# Patient Record
Sex: Male | Born: 1955 | Race: Black or African American | Hispanic: No | Marital: Single | Smoking: Former smoker
Health system: Southern US, Community
[De-identification: ages and names within clinical notes are randomized; demographics above are authoritative.]

## PROBLEM LIST (undated history)

## (undated) DIAGNOSIS — M545 Low back pain, unspecified: Secondary | ICD-10-CM

## (undated) DIAGNOSIS — I10 Essential (primary) hypertension: Secondary | ICD-10-CM

## (undated) DIAGNOSIS — I509 Heart failure, unspecified: Secondary | ICD-10-CM

## (undated) HISTORY — DX: Low back pain, unspecified: M54.50

## (undated) HISTORY — DX: Heart failure, unspecified: I50.9

## (undated) HISTORY — DX: Essential (primary) hypertension: I10

## (undated) HISTORY — PX: HERNIA REPAIR: SHX51

---

## 2020-07-09 ENCOUNTER — Ambulatory Visit (INDEPENDENT_AMBULATORY_CARE_PROVIDER_SITE_OTHER): Payer: Medicaid Other | Admitting: Internal Medicine

## 2020-07-09 ENCOUNTER — Other Ambulatory Visit: Payer: Self-pay

## 2020-07-09 ENCOUNTER — Encounter: Payer: Self-pay | Admitting: Internal Medicine

## 2020-07-09 VITALS — BP 199/122 | HR 81 | Temp 98.1°F | Wt 275.7 lb

## 2020-07-09 DIAGNOSIS — I10 Essential (primary) hypertension: Secondary | ICD-10-CM | POA: Diagnosis not present

## 2020-07-09 DIAGNOSIS — M549 Dorsalgia, unspecified: Secondary | ICD-10-CM | POA: Insufficient documentation

## 2020-07-09 DIAGNOSIS — I509 Heart failure, unspecified: Secondary | ICD-10-CM

## 2020-07-09 DIAGNOSIS — Z131 Encounter for screening for diabetes mellitus: Secondary | ICD-10-CM | POA: Diagnosis not present

## 2020-07-09 DIAGNOSIS — Z23 Encounter for immunization: Secondary | ICD-10-CM | POA: Diagnosis present

## 2020-07-09 DIAGNOSIS — M5442 Lumbago with sciatica, left side: Secondary | ICD-10-CM | POA: Diagnosis not present

## 2020-07-09 DIAGNOSIS — I5030 Unspecified diastolic (congestive) heart failure: Secondary | ICD-10-CM | POA: Insufficient documentation

## 2020-07-09 DIAGNOSIS — G8929 Other chronic pain: Secondary | ICD-10-CM | POA: Diagnosis not present

## 2020-07-09 LAB — POCT GLYCOSYLATED HEMOGLOBIN (HGB A1C): Hemoglobin A1C: 5.2 % (ref 4.0–5.6)

## 2020-07-09 LAB — GLUCOSE, CAPILLARY: Glucose-Capillary: 92 mg/dL (ref 70–99)

## 2020-07-09 NOTE — Assessment & Plan Note (Signed)
Patient reports having chronic back pain.  He fell down a flight of stairs approximately 10 years ago.  Reports the back pain comes and goes. He describes pain that starts in his back and goes to his left hip and down to his left knee.  We will check lab work today and start with conservative therapy given his chronic problem.  If no improvement will consider imaging.  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

## 2020-07-09 NOTE — Patient Instructions (Addendum)
1. Heart failure, unspecified HF chronicity, unspecified heart failure type (HCC) We will obtain an ultrasound of your heart to better evaluate your history of heart failure.  After your blood work is back we can start medications to treat your heart failure. - Lipid Profile - ECHOCARDIOGRAM COMPLETE; Future  2. Hypertension, unspecified type I will call with the results of your lab work tomorrow and we can start medications at this time to treat your blood pressure. - CMP14 + Anion Gap - POC Hbg A1C  3. Chronic left-sided low back pain with left-sided sciatica We will obtain records from the [place] system and once her blood work is back I can recommend medications to help with back pain. - CBC with Diff

## 2020-07-09 NOTE — Progress Notes (Signed)
   CC: Hypertension   HPI:Mr.Tahir Blank is a 64 y.o. male who presents for evaluation of hypertension and low back pain. Please see individual problem based A/P for details.  Depression, PHQ-9: Based on the patients    Office Visit from 07/09/2020 in [facility]  PHQ-9 Total Score 5    Mild depression  Past Medical History:  Diagnosis Date  . Hypertension    Past Surgical History:  Procedure Laterality Date  . HERNIA REPAIR     When he was approximately 6 years   Family History  Problem Relation Age of Onset  . Stroke Mother 49  . Heart attack Father   . Cancer Father        17 , unknown type - passed away from this cancer  . Breast cancer Sister 61    Review of Systems:   Review of Systems  Constitutional: Negative for chills and fever.  HENT: Negative for sinus pain and sore throat.   Eyes: Negative for double vision and redness.  Respiratory: Negative for cough, sputum production and wheezing.   Cardiovascular: Positive for leg swelling. Negative for chest pain.  Gastrointestinal: Negative for nausea and vomiting.  Genitourinary: Negative for dysuria and urgency.  Musculoskeletal: Positive for back pain. Negative for joint pain.  Skin: Negative for itching and rash.  Neurological: Negative for dizziness and headaches.  Endo/Heme/Allergies: Negative for polydipsia. Does not bruise/bleed easily.  Psychiatric/Behavioral: Negative for memory loss and substance abuse.     Physical Exam: Vitals:   07/09/20 1010 07/09/20 1100  BP: (!) 209/118 (!) 199/122  Pulse: 85 81  Temp: 98.1 F (36.7 C)   TempSrc: Oral   SpO2: 100%   Weight: 275 lb 11.2 oz (125.1 kg)     General: NAD, nl appearance HE: Normocephalic, atraumatic , EOMI, Conjunctivae normal ENT: No congestion, no rhinorrhea, no exudate or erythema  Cardiovascular: Normal rate, regular rhythm.  No murmurs, rubs, or gallops Pulmonary : Effort normal, breath sounds normal. No  wheezes, rales, or rhonchi Abdominal: soft, nontender,  bowel sounds present Musculoskeletal: Bilateral 2+ lower extremity edema, no deformities Skin: Cool hands and feet, contusion on right forearm Psychiatric/Behavioral:  normal mood, normal behavior     Assessment & Plan:   See Encounters Tab for problem based charting.  Patient discussed with Dr. Cleda Daub

## 2020-07-09 NOTE — Assessment & Plan Note (Addendum)
Frank Figueroa presents to clinic today as a new patient with a history of heart failure.  He is unsure whether he has systolic or diastolic heart failure.  He does not have any medical records to present at this time.  Systolic blood pressure is elevated to 200 on exam and I would expect he has diastolic heart failure given this data. Denies a history of ischemic heart disease.  He denies orthopnea or PND.  On exam he does have lower extremity pitting edema.  He has been out of his Lasix.  Reports taking Lasix 40 mg twice a day in the past.  Assessment: History of heart failure , volume overloaded on exam.  Plan: - Check BMP, will prescribe Lasix pending lab results -Follow-up in 1 week -TTE -Lipid panel  Addendum:   The 10-year ASCVD risk score Frank Figueroa) is: 18%   Values used to calculate the score:     Age: 64 years     Sex: Male     Is Non-Hispanic African American: Yes     Diabetic: No     Tobacco smoker: No     Systolic Blood Pressure: 199 mmHg     Is BP treated: No     HDL Cholesterol: 53 mg/dL     Total Cholesterol: 163 mg/dL

## 2020-07-09 NOTE — Assessment & Plan Note (Signed)
Hypertension: Patient's BP today is 209/118 and 199/122 with a goal of <140/80.  The patient is not currently on any medication.  Reports he was on lisinopril in the past.  Denies chest pain, headache, visual changes, lightheadedness, weakness, or dizziness on standing.   Assessment: Uncontrolled hypertension.  We will check lab work today and start medication pending results.  He will likely need multiple blood pressure medications and will consider starting a combination pill.  Plan: -BMP

## 2020-07-10 ENCOUNTER — Telehealth: Payer: Self-pay | Admitting: Internal Medicine

## 2020-07-10 ENCOUNTER — Telehealth: Payer: Self-pay | Admitting: *Deleted

## 2020-07-10 DIAGNOSIS — E785 Hyperlipidemia, unspecified: Secondary | ICD-10-CM | POA: Insufficient documentation

## 2020-07-10 DIAGNOSIS — I10 Essential (primary) hypertension: Secondary | ICD-10-CM

## 2020-07-10 DIAGNOSIS — I509 Heart failure, unspecified: Secondary | ICD-10-CM

## 2020-07-10 LAB — CMP14 + ANION GAP
ALT: 28 IU/L (ref 0–44)
AST: 30 IU/L (ref 0–40)
Albumin/Globulin Ratio: 1.3 (ref 1.2–2.2)
Albumin: 4.4 g/dL (ref 3.8–4.8)
Alkaline Phosphatase: 119 IU/L (ref 44–121)
Anion Gap: 14 mmol/L (ref 10.0–18.0)
BUN/Creatinine Ratio: 13 (ref 10–24)
BUN: 26 mg/dL (ref 8–27)
Bilirubin Total: 0.5 mg/dL (ref 0.0–1.2)
CO2: 23 mmol/L (ref 20–29)
Calcium: 8.9 mg/dL (ref 8.6–10.2)
Chloride: 103 mmol/L (ref 96–106)
Creatinine, Ser: 1.96 mg/dL — ABNORMAL HIGH (ref 0.76–1.27)
GFR calc Af Amer: 41 mL/min/{1.73_m2} — ABNORMAL LOW (ref >59)
GFR calc non Af Amer: 35 mL/min/{1.73_m2} — ABNORMAL LOW (ref >59)
Globulin, Total: 3.5 g/dL (ref 1.5–4.5)
Glucose: 92 mg/dL (ref 65–99)
Potassium: 4.5 mmol/L (ref 3.5–5.2)
Sodium: 140 mmol/L (ref 134–144)
Total Protein: 7.9 g/dL (ref 6.0–8.5)

## 2020-07-10 LAB — CBC WITH DIFFERENTIAL/PLATELET
Basophils Absolute: 0 10*3/uL (ref 0.0–0.2)
Basos: 0 %
EOS (ABSOLUTE): 0.2 10*3/uL (ref 0.0–0.4)
Eos: 2 %
Hematocrit: 42.5 % (ref 37.5–51.0)
Hemoglobin: 14 g/dL (ref 13.0–17.7)
Immature Grans (Abs): 0 10*3/uL (ref 0.0–0.1)
Immature Granulocytes: 0 %
Lymphocytes Absolute: 2.3 10*3/uL (ref 0.7–3.1)
Lymphs: 23 %
MCH: 30.9 pg (ref 26.6–33.0)
MCHC: 32.9 g/dL (ref 31.5–35.7)
MCV: 94 fL (ref 79–97)
Monocytes Absolute: 0.8 10*3/uL (ref 0.1–0.9)
Monocytes: 8 %
Neutrophils Absolute: 6.5 10*3/uL (ref 1.4–7.0)
Neutrophils: 67 %
Platelets: 142 10*3/uL — ABNORMAL LOW (ref 150–450)
RBC: 4.53 x10E6/uL (ref 4.14–5.80)
RDW: 12.9 % (ref 11.6–15.4)
WBC: 9.9 10*3/uL (ref 3.4–10.8)

## 2020-07-10 LAB — LIPID PANEL
Chol/HDL Ratio: 3.1 ratio (ref 0.0–5.0)
Cholesterol, Total: 163 mg/dL (ref 100–199)
HDL: 53 mg/dL (ref >39)
LDL Chol Calc (NIH): 99 mg/dL (ref 0–99)
Triglycerides: 54 mg/dL (ref 0–149)
VLDL Cholesterol Cal: 11 mg/dL (ref 5–40)

## 2020-07-10 NOTE — Telephone Encounter (Signed)
Called patient 4 times today without an answer.  [phone] . I am unsure if the phone number listed in the chart is correct.  I would like to discuss patient's lab results before sending in prescriptions.  Please verify patient's phone number if he calls the clinic.

## 2020-07-10 NOTE — Telephone Encounter (Signed)
Pt calls and states he was supposed to have some meds sent to his pharmacy per dr Barbaraann Faster

## 2020-07-10 NOTE — Addendum Note (Signed)
Addended by: Gardenia Phlegm on: 07/10/2020 09:48 AM   Modules accepted: Orders

## 2020-07-10 NOTE — Telephone Encounter (Signed)
Called patient to share laboratory results x2.  No answer.  Plan to start following medications once unable to reach patient:  Lasix 40 mg twice daily Olmesartan 20 mg daily Amlodipine 5 mg daily  Patient is scheduled for a transthoracic echo to better evaluate his heart.  He reports a history of heart failure but cannot specify whether this is systolic or diastolic.  When these results return we will start him on goal-directed therapy if he has systolic heart failure.  In addition he has follow-up in 1 week to monitor diuresis and kidney function.

## 2020-07-11 MED ORDER — FUROSEMIDE 40 MG PO TABS: 40.0000 mg | ORAL_TABLET | Freq: Two times a day (BID) | ORAL | 1 refills | Status: DC

## 2020-07-11 MED ORDER — OLMESARTAN MEDOXOMIL 40 MG PO TABS: 40.0000 mg | ORAL_TABLET | Freq: Every day | ORAL | 1 refills | Status: DC

## 2020-07-11 MED ORDER — AMLODIPINE BESYLATE 5 MG PO TABS: 5.0000 mg | ORAL_TABLET | Freq: Every day | ORAL | 1 refills | Status: DC

## 2020-07-11 NOTE — Telephone Encounter (Signed)
Pt is having phone trouble, he will try to answer his phone today pls contact [phone]

## 2020-07-11 NOTE — Telephone Encounter (Signed)
Was able to reach patient today at phone number listed in chart.  His phone was temporarily off.  Sending prescriptions to [facility] on [address]  -Amlodipine 5 mg daily -Olmesartan 20 mg daily -Lasix 40 mg twice daily  -Follow-up in 1 week

## 2020-07-12 ENCOUNTER — Telehealth: Payer: Self-pay

## 2020-07-12 NOTE — Progress Notes (Signed)
Internal Medicine Clinic Attending  Case discussed with Dr. Barbaraann Faster  At the time of the visit.  We reviewed the resident's history and exam and pertinent patient test results.  I agree with the assessment, diagnosis, and plan of care documented in the resident's note. Labs returned reassuring, awaiting outpatient TTE, will start lasix, olmesartan and amlodipine in the interim.

## 2020-07-12 NOTE — Telephone Encounter (Signed)
Patient has chronic back pain without red flag symptoms. Recommended tylenol 1000 mg TID. Avoiding NSAID given recent kidney function. He has follow-up scheduled for heart failure.

## 2020-07-12 NOTE — Telephone Encounter (Signed)
Requesting med for pain, please call pt back.  

## 2020-07-19 ENCOUNTER — Encounter: Payer: Self-pay | Admitting: Internal Medicine

## 2020-07-19 ENCOUNTER — Ambulatory Visit (INDEPENDENT_AMBULATORY_CARE_PROVIDER_SITE_OTHER): Payer: Medicaid Other | Admitting: Internal Medicine

## 2020-07-19 ENCOUNTER — Other Ambulatory Visit: Payer: Self-pay

## 2020-07-19 VITALS — BP 152/77 | HR 81 | Temp 98.3°F | Ht 72.0 in | Wt 276.8 lb

## 2020-07-19 DIAGNOSIS — Z23 Encounter for immunization: Secondary | ICD-10-CM

## 2020-07-19 DIAGNOSIS — I10 Essential (primary) hypertension: Secondary | ICD-10-CM | POA: Diagnosis present

## 2020-07-19 DIAGNOSIS — I509 Heart failure, unspecified: Secondary | ICD-10-CM

## 2020-07-19 LAB — BRAIN NATRIURETIC PEPTIDE: B Natriuretic Peptide: 62.2 pg/mL (ref 0.0–100.0)

## 2020-07-19 NOTE — Patient Instructions (Addendum)
Thank you, [name] for allowing [place] to provide your care today. Today we discussed heart failure , neuropathy, and high blood pressure.    I have ordered the following labs for you:   Lab Orders     BMP8+Anion Gap     Brain natriuretic peptide   Tests ordered today: none   Referrals ordered today:   Referral Orders  No referral(s) requested today     I have ordered the following medication/changed the following medications:   Stop the following medications: There are no discontinued medications.   Start the following medications: No orders of the defined types were placed in this encounter.    Follow up: pending results of blood work   Remember: I will call with results of blood work on Monday  Should you have any questions or concerns please call the internal medicine clinic at [phone].      Thurmon Fair, M.D. [facility]

## 2020-07-19 NOTE — Progress Notes (Signed)
   CC: hypertension  HPI:[name] is a 64 y.o. male who presents for evaluation of hypertension and heart failure. Patient was late for his visit today, but we were able to address acute problems he was following up for. At his next visit I would like to address his hyperlipidemia and chronic pain. He reports he has been compliant on his blood pressure medications. He says he is urinating well, but cant say if his edema is improving. Please see individual problem based A/P for details.  Past Medical History:  Diagnosis Date  . Heart failure (HCC)   . Hypertension   . Low back pain    Review of Systems:   Review of Systems  Eyes: Negative for blurred vision and double vision.  Neurological: Negative for dizziness and headaches.     Physical Exam: Vitals:   07/19/20 1342  BP: (!) 152/77  Pulse: 81  Temp: 98.3 F (36.8 C)  TempSrc: Oral  SpO2: 99%  Weight: 276 lb 12.8 oz (125.6 kg)  Height: 6' (1.829 m)   General: NAD, nl appearance HEENT: Normocephalic, atraumatic , Conjunctiva nl  Cardiovascular: Normal rate, regular rhythm.  No murmurs, rubs, or gallops Pulmonary : Equal breath sounds, No wheezes, rales, or rhonchi Abdominal: soft, nontender,  bowel sounds present EXT: 2+ LE edema, no deformities   Assessment & Plan:   See Encounters Tab for problem based charting.  Patient discussed with Dr. Antony Contras

## 2020-07-20 LAB — BMP8+ANION GAP
Anion Gap: 14 mmol/L (ref 10.0–18.0)
BUN/Creatinine Ratio: 16 (ref 10–24)
BUN: 39 mg/dL — ABNORMAL HIGH (ref 8–27)
CO2: 22 mmol/L (ref 20–29)
Calcium: 8.8 mg/dL (ref 8.6–10.2)
Chloride: 104 mmol/L (ref 96–106)
Creatinine, Ser: 2.37 mg/dL — ABNORMAL HIGH (ref 0.76–1.27)
GFR calc Af Amer: 32 mL/min/{1.73_m2} — ABNORMAL LOW (ref >59)
GFR calc non Af Amer: 28 mL/min/{1.73_m2} — ABNORMAL LOW (ref >59)
Glucose: 94 mg/dL (ref 65–99)
Potassium: 4.3 mmol/L (ref 3.5–5.2)
Sodium: 140 mmol/L (ref 134–144)

## 2020-07-21 ENCOUNTER — Encounter: Payer: Self-pay | Admitting: Internal Medicine

## 2020-07-21 NOTE — Assessment & Plan Note (Signed)
°  Patient presents today with continued signs of volume overload, may be only slightly improved since last visit.  Endorses he has been taking Lasix 40 mg twice daily and having good urine output, although unmeasured.  He reports he has not been contacted to schedule his echo yet. He was given a number to call if not contacted by Monday. We will check a BNP today.  His weight for last 2 visits: Wt Readings from Last 3 Encounters:  07/19/20 276 lb 12.8 oz (125.6 kg)  07/09/20 275 lb 11.2 oz (125.1 kg)   Assessment: Hx of HF, unspecified type. Volume overloaded on exam. Will consider increasing lasix dose after lab work returns. Follow up in 1-2 weeks.  Plan:  -Continue Lasix 40 mg twice daily -TTE scheduled -Bnp -BMP

## 2020-07-21 NOTE — Assessment & Plan Note (Signed)
HPI:  Patient's BP today is much better. His BP at last visit was 199/122 and today his BP is 152/77. Goal BP is at least<140/80. We are still obtaining medical records and waiting on patient to complete echo to evaluate HF. He may need additional heart failure medications which effect BP, so holding off on additional medications today.  BMP Latest Ref Rng & Units 07/09/2020  Glucose 65 - 99 mg/dL 92  BUN 8 - 27 mg/dL 26  Creatinine 3.55 - 9.74 mg/dL 1.63(A)  BUN/Creat Ratio 10 - 24 13  Sodium 134 - 144 mmol/L 140  Potassium 3.5 - 5.2 mmol/L 4.5  Chloride 96 - 106 mmol/L 103  CO2 20 - 29 mmol/L 23  Calcium 8.6 - 10.2 mg/dL 8.9   Medication changes: No Assessment: HTN, improved since starting amlodipine and olmesartan.  Creatinine 1.96 at last visit, rechecking today. Plan: Continue amlodipine 5 mg daily Continue olmesartan 40 mg daily BMP today

## 2020-07-22 NOTE — Progress Notes (Signed)
Internal Medicine Clinic Attending  Case discussed with Dr. Barbaraann Faster  At the time of the visit.  We reviewed the resident's history and exam and pertinent patient test results.  I agree with the assessment, diagnosis, and plan of care documented in the resident's note.   BMP checked this visit shows and increase in creatinine and BUN from prior. This could be transient elevation after initiation of olmesartan vs. Prerenal injury from lasix use. Given normal BNP, would recommend holding lasix for a few days and having patient return for repeat labs. If renal function is unchanged, likely safe to assume this is from ARB initiation and continue with current management. If renal function improves, he likely needs a lower maintenance dose of lasix.

## 2020-07-24 ENCOUNTER — Telehealth: Payer: Self-pay | Admitting: Internal Medicine

## 2020-07-24 NOTE — Telephone Encounter (Signed)
Called patient x2 to ask him to hold his Lasix and come in 2 days later for BMP. Mailbox is full. Will attempt again tomorrow.

## 2020-07-25 ENCOUNTER — Telehealth: Payer: Self-pay | Admitting: Internal Medicine

## 2020-07-25 DIAGNOSIS — I10 Essential (primary) hypertension: Secondary | ICD-10-CM

## 2020-07-25 NOTE — Telephone Encounter (Signed)
Patient reports his phone has not been working. He will come to clinic tomorrow to have his blood work done. He has not received a call or has missed the call to schedule his TTE.

## 2020-07-26 ENCOUNTER — Encounter: Payer: Medicaid Other | Admitting: Internal Medicine

## 2020-07-26 ENCOUNTER — Telehealth: Payer: Self-pay

## 2020-07-26 ENCOUNTER — Other Ambulatory Visit: Payer: Self-pay

## 2020-07-26 ENCOUNTER — Other Ambulatory Visit (INDEPENDENT_AMBULATORY_CARE_PROVIDER_SITE_OTHER): Payer: Medicaid Other

## 2020-07-26 DIAGNOSIS — I10 Essential (primary) hypertension: Secondary | ICD-10-CM | POA: Diagnosis not present

## 2020-07-26 NOTE — Telephone Encounter (Signed)
This RN notified that patient was in the lab for lab-only appt and was c/o of eye pain.  RN presented to lab and patient states he thinks something "got into his left eye".  Left eye was red.  RN informed patient he will need an appt to be seen for evaluation and an appointment was offered with his PCP today at 2:15.  Patient declined this appt, states he "doesn't know if he can come back [facility] currently closed for lunch).  RN instructed patient to call back if he desires appt this afternoon or if eye becomes worse over weekend he can go to urgent care for evaluation.  He verbalized understanding. SChaplin, RN,BSN

## 2020-07-27 LAB — BMP8+ANION GAP
Anion Gap: 17 mmol/L (ref 10.0–18.0)
BUN/Creatinine Ratio: 17 (ref 10–24)
BUN: 30 mg/dL — ABNORMAL HIGH (ref 8–27)
CO2: 24 mmol/L (ref 20–29)
Calcium: 9.6 mg/dL (ref 8.6–10.2)
Chloride: 102 mmol/L (ref 96–106)
Creatinine, Ser: 1.76 mg/dL — ABNORMAL HIGH (ref 0.76–1.27)
GFR calc Af Amer: 47 mL/min/{1.73_m2} — ABNORMAL LOW (ref >59)
GFR calc non Af Amer: 40 mL/min/{1.73_m2} — ABNORMAL LOW (ref >59)
Glucose: 78 mg/dL (ref 65–99)
Potassium: 4.2 mmol/L (ref 3.5–5.2)
Sodium: 143 mmol/L (ref 134–144)

## 2020-07-30 NOTE — Progress Notes (Signed)
Called and updated patient on recent BMP.  Creatinine has improved and he will continue his Lasix and ARB

## 2020-08-16 ENCOUNTER — Other Ambulatory Visit: Payer: Self-pay

## 2020-08-16 ENCOUNTER — Ambulatory Visit (INDEPENDENT_AMBULATORY_CARE_PROVIDER_SITE_OTHER): Payer: Medicaid Other | Admitting: Internal Medicine

## 2020-08-16 ENCOUNTER — Encounter: Payer: Self-pay | Admitting: Internal Medicine

## 2020-08-16 VITALS — BP 149/108 | HR 73 | Temp 97.8°F | Ht 72.0 in | Wt 279.2 lb

## 2020-08-16 DIAGNOSIS — S0592XA Unspecified injury of left eye and orbit, initial encounter: Secondary | ICD-10-CM | POA: Diagnosis not present

## 2020-08-16 DIAGNOSIS — S0590XA Unspecified injury of unspecified eye and orbit, initial encounter: Secondary | ICD-10-CM | POA: Insufficient documentation

## 2020-08-16 DIAGNOSIS — M5416 Radiculopathy, lumbar region: Secondary | ICD-10-CM

## 2020-08-16 DIAGNOSIS — M5412 Radiculopathy, cervical region: Secondary | ICD-10-CM

## 2020-08-16 DIAGNOSIS — I10 Essential (primary) hypertension: Secondary | ICD-10-CM | POA: Diagnosis not present

## 2020-08-16 MED ORDER — TETRAHYDROZOLINE-ZN SULFATE 0.05-0.25 % OP SOLN: 2.0000 [drp] | Freq: Three times a day (TID) | OPHTHALMIC | 0 refills | Status: DC | PRN

## 2020-08-16 NOTE — Assessment & Plan Note (Signed)
Something flew in his eye about three months ago and he has had intermittent eye pain since then. About three days ago it became much worse and he had difficulty opening his eye. It has improved but continues to hurt a lot and has continued watering. It feels like there is something in his eye. No changes or blurring of vision.  On physical exam there is redness, especially to the inner lid, with sensitivity.   - refer to ophthalmology to evaluate  - visine drops prn  - alternate cool and warm compresses

## 2020-08-16 NOTE — Progress Notes (Addendum)
   CC: eye pain  HPI:  [name] is a 64 y.o. with PMH as below.   Please see A&P for assessment of the patient's acute and chronic medical conditions.   Past Medical History:  Diagnosis Date  . Heart failure (HCC)   . Hypertension   . Low back pain    Review of Systems:   Review of Systems  Constitutional: Negative for chills, fever and weight loss.  Eyes: Positive for pain, discharge and redness. Negative for blurred vision, double vision and photophobia.  Cardiovascular: Negative for chest pain and leg swelling.  Gastrointestinal: Negative for abdominal pain.  Musculoskeletal: Positive for back pain, joint pain, myalgias and neck pain. Negative for falls.  Neurological: Positive for tingling, sensory change and focal weakness. Negative for dizziness, tremors and headaches.   Physical Exam:   Constitution: NAD, appears stated age Cardio: RRR, no m/r/g, no LE edema  Respiratory: CTA, no w/r/r MSK: spurlings positive bilaterally but with pain radiating down the left for both tests  Upper extremities: LUE strength 4/5 in all ranges of motion; RUE 5/5 Lower extremities: RLE: strength 4/5 all ranges of motion; LLE: strength 2/5 knee extension, 4/5 plantar and dorsiflexion; 3/5 hip flexion Neuro: normal affect, a&ox3 Skin: c/d/i    Vitals:   08/16/20 0910 08/16/20 0918  BP: (!) 173/93 (!) 149/108  Pulse: 80 73  Temp: 97.8 F (36.6 C)   TempSrc: Oral   SpO2: 100%   Weight: 279 lb 3.2 oz (126.6 kg)   Height: 6' (1.829 m)      Assessment & Plan:   See Encounters Tab for problem based charting.  Patient discussed with Dr. Mayford Knife

## 2020-08-16 NOTE — Patient Instructions (Signed)
Thank you for allowing [place] to provide your care today. Today we discussed your eye pain and back pain   I have ordered the following labs for you: Basic metabolic panel     I will call if any are abnormal.    Please follow-up in two weeks for blood pressure check and other healthcare maintenance.    Please call the internal medicine center clinic if you have any questions or concerns, we may be able to help and keep you from a long and expensive emergency room wait. Our clinic and after hours phone number is [phone], the best time to call is Monday through Friday 9 am to 4 pm but there is always someone available 24/7 if you have an emergency. If you need medication refills please notify your pharmacy one week in advance and they will send [place] a request.

## 2020-08-17 LAB — BMP8+ANION GAP
Anion Gap: 14 mmol/L (ref 10.0–18.0)
BUN/Creatinine Ratio: 12 (ref 10–24)
BUN: 22 mg/dL (ref 8–27)
CO2: 20 mmol/L (ref 20–29)
Calcium: 8.8 mg/dL (ref 8.6–10.2)
Chloride: 106 mmol/L (ref 96–106)
Creatinine, Ser: 1.81 mg/dL — ABNORMAL HIGH (ref 0.76–1.27)
GFR calc Af Amer: 45 mL/min/{1.73_m2} — ABNORMAL LOW (ref >59)
GFR calc non Af Amer: 39 mL/min/{1.73_m2} — ABNORMAL LOW (ref >59)
Glucose: 97 mg/dL (ref 65–99)
Potassium: 4.2 mmol/L (ref 3.5–5.2)
Sodium: 140 mmol/L (ref 134–144)

## 2020-08-19 DIAGNOSIS — M5416 Radiculopathy, lumbar region: Secondary | ICD-10-CM | POA: Insufficient documentation

## 2020-08-19 DIAGNOSIS — M5412 Radiculopathy, cervical region: Secondary | ICD-10-CM | POA: Insufficient documentation

## 2020-08-19 MED ORDER — GABAPENTIN 100 MG PO CAPS: 100.0000 mg | ORAL_CAPSULE | Freq: Three times a day (TID) | ORAL | 2 refills | Status: DC

## 2020-08-19 MED ORDER — TETRAHYDROZOLINE-ZN SULFATE 0.05-0.25 % OP SOLN
2.0000 [drp] | Freq: Three times a day (TID) | OPHTHALMIC | 0 refills | Status: DC | PRN
Start: 2020-08-19 — End: 2021-01-20

## 2020-08-19 NOTE — Assessment & Plan Note (Deleted)
He has pain, weakness, tightness and tingling in his left arm that has been going on for a while but seems to be getting worse, he is not sure how long. He denies chest pain or shortness of breath. The pain feels like his arm is just very tight sometimes. He does additionally have some mild shoulder pain.  Strength is decreased, about 4/5, compared to the right. Full active ROM of the shoulder. Spurlings produces neck and shoulder pain on the left when testing for either side.   - MRI cervical ordered

## 2020-08-19 NOTE — Assessment & Plan Note (Addendum)
He has pain, weakness, tightness and tingling in his left arm that has been going on for a while but seems to be getting progressively worse, he is not sure how long. He denies chest pain or shortness of breath. The pain feels like his arm is just very tight sometimes. He does additionally have some mild shoulder pain.  Strength is decreased, about 4/5, compared to the right. Full active ROM of the shoulder. Spurlings produces neck and shoulder pain on the left when testing for either side.   - MRI cervical ordered

## 2020-08-19 NOTE — Progress Notes (Signed)
Internal Medicine Clinic Attending  Case discussed with Dr. Seawell  At the time of the visit.  We reviewed the resident's history and exam and pertinent patient test results.  I agree with the assessment, diagnosis, and plan of care documented in the resident's note.  

## 2020-08-19 NOTE — Assessment & Plan Note (Signed)
He has had back pain and left lower extremity weakness for several years, but this seems to getting much worse recently. He was told in the past he had a pinched disk and might need surgery at some point about five years ago. He endorses LLE pain and tingling as well. He was previously on gabapentin for this, which did help his pain. He is not sure what his previous dose was. On PE left leg is significantly weaker. Tingling and pain is consistent with L4-L5 dermatome. 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   - MRI ordered  - start gabapentin at 100 mg tid - discussed adverse effects

## 2020-08-19 NOTE — Addendum Note (Signed)
Addended by: Guinevere Scarlet A on: 08/19/2020 11:52 AM   Modules accepted: Orders

## 2020-08-22 ENCOUNTER — Telehealth: Payer: Self-pay | Admitting: *Deleted

## 2020-08-22 NOTE — Telephone Encounter (Signed)
Call placed to patient to see if has an appt with eye doctor. No answer and [facility] is full. Unable to leave message. Kinnie Feil, BSN, RN-BC

## 2020-08-23 NOTE — Telephone Encounter (Signed)
Patient called back. States he was told by Dr. Laban Emperor office that he needs to be referred to Ophthalmologist not a retina specialist. Practice Manager also received call from Dr. Laban Emperor office stating same. Referral will need to be sent to ophthalmologist  Also, patient states he spoke with Provider about acid reflux but no med was sent to Pharmacy. Kinnie Feil, BSN, RN-BC

## 2020-08-26 NOTE — Telephone Encounter (Signed)
His referral was to ophthalmology. I will send in a medication for reflux.

## 2020-08-26 NOTE — Telephone Encounter (Signed)
Addendum to note placed 08/23/2020: Patient reported he was still having issues with his left eye and would still like to be seen by Ophthalmologist. Kinnie Feil, BSN, RN-BC

## 2020-09-04 ENCOUNTER — Other Ambulatory Visit: Payer: Self-pay | Admitting: Internal Medicine

## 2020-09-04 DIAGNOSIS — I509 Heart failure, unspecified: Secondary | ICD-10-CM

## 2020-09-04 DIAGNOSIS — I10 Essential (primary) hypertension: Secondary | ICD-10-CM

## 2020-09-09 ENCOUNTER — Encounter: Payer: Self-pay | Admitting: Internal Medicine

## 2020-10-11 ENCOUNTER — Encounter: Payer: Medicaid Other | Admitting: Internal Medicine

## 2020-10-16 ENCOUNTER — Encounter: Payer: Medicaid Other | Admitting: Internal Medicine

## 2020-11-03 ENCOUNTER — Other Ambulatory Visit: Payer: Self-pay | Admitting: Internal Medicine

## 2020-11-03 DIAGNOSIS — I509 Heart failure, unspecified: Secondary | ICD-10-CM

## 2020-11-03 DIAGNOSIS — I10 Essential (primary) hypertension: Secondary | ICD-10-CM

## 2020-11-15 ENCOUNTER — Ambulatory Visit (HOSPITAL_COMMUNITY): Admission: RE | Admit: 2020-11-15 | Payer: Medicaid Other | Source: Ambulatory Visit

## 2020-11-20 ENCOUNTER — Ambulatory Visit (HOSPITAL_COMMUNITY): Admission: RE | Admit: 2020-11-20 | Payer: Medicaid Other | Source: Ambulatory Visit

## 2021-01-16 ENCOUNTER — Telehealth: Payer: Self-pay | Admitting: *Deleted

## 2021-01-16 NOTE — Telephone Encounter (Signed)
Patient called in asking for appt and was transferred to Triage RN. States he has had left sided numbness x 1 week. Today his left leg "doesn't want to work right." Patient's speech sounds slurred but am unaware of his baseline. Patient was advised to head directly to ED as these could be signs of a stroke. States he is on his way to p/u his niece and will have her take him. He is, again, advised to head directly to ED. He states, "it's been going on for a week and I feel ok."

## 2021-01-16 NOTE — Telephone Encounter (Signed)
Agree, thank you

## 2021-01-17 ENCOUNTER — Emergency Department (HOSPITAL_COMMUNITY): Payer: Medicaid Other

## 2021-01-17 ENCOUNTER — Inpatient Hospital Stay (HOSPITAL_COMMUNITY): Payer: Medicaid Other

## 2021-01-17 ENCOUNTER — Inpatient Hospital Stay (HOSPITAL_COMMUNITY)
Admission: EM | Admit: 2021-01-17 | Discharge: 2021-01-20 | DRG: 305 | Disposition: A | Discharge status: Home / Self Care | Payer: Medicaid Other | Attending: Family Medicine | Admitting: Family Medicine

## 2021-01-17 ENCOUNTER — Encounter (HOSPITAL_COMMUNITY): Payer: Self-pay

## 2021-01-17 ENCOUNTER — Other Ambulatory Visit: Payer: Self-pay

## 2021-01-17 DIAGNOSIS — Z765 Malingerer [conscious simulation]: Secondary | ICD-10-CM

## 2021-01-17 DIAGNOSIS — I130 Hypertensive heart and chronic kidney disease with heart failure and stage 1 through stage 4 chronic kidney disease, or unspecified chronic kidney disease: Secondary | ICD-10-CM | POA: Diagnosis present

## 2021-01-17 DIAGNOSIS — Z9119 Patient's noncompliance with other medical treatment and regimen: Secondary | ICD-10-CM | POA: Diagnosis not present

## 2021-01-17 DIAGNOSIS — I160 Hypertensive urgency: Principal | ICD-10-CM | POA: Diagnosis present

## 2021-01-17 DIAGNOSIS — Z79899 Other long term (current) drug therapy: Secondary | ICD-10-CM | POA: Diagnosis not present

## 2021-01-17 DIAGNOSIS — Z8673 Personal history of transient ischemic attack (TIA), and cerebral infarction without residual deficits: Secondary | ICD-10-CM | POA: Diagnosis not present

## 2021-01-17 DIAGNOSIS — N1832 Chronic kidney disease, stage 3b: Secondary | ICD-10-CM | POA: Diagnosis present

## 2021-01-17 DIAGNOSIS — I6529 Occlusion and stenosis of unspecified carotid artery: Secondary | ICD-10-CM | POA: Diagnosis present

## 2021-01-17 DIAGNOSIS — I509 Heart failure, unspecified: Secondary | ICD-10-CM | POA: Diagnosis present

## 2021-01-17 DIAGNOSIS — Z20822 Contact with and (suspected) exposure to covid-19: Secondary | ICD-10-CM | POA: Diagnosis present

## 2021-01-17 DIAGNOSIS — M5410 Radiculopathy, site unspecified: Secondary | ICD-10-CM | POA: Diagnosis present

## 2021-01-17 DIAGNOSIS — T465X6A Underdosing of other antihypertensive drugs, initial encounter: Secondary | ICD-10-CM | POA: Diagnosis present

## 2021-01-17 DIAGNOSIS — Z888 Allergy status to other drugs, medicaments and biological substances status: Secondary | ICD-10-CM

## 2021-01-17 DIAGNOSIS — Z803 Family history of malignant neoplasm of breast: Secondary | ICD-10-CM

## 2021-01-17 DIAGNOSIS — Z8249 Family history of ischemic heart disease and other diseases of the circulatory system: Secondary | ICD-10-CM | POA: Diagnosis not present

## 2021-01-17 DIAGNOSIS — Z823 Family history of stroke: Secondary | ICD-10-CM | POA: Diagnosis not present

## 2021-01-17 DIAGNOSIS — M4802 Spinal stenosis, cervical region: Secondary | ICD-10-CM | POA: Diagnosis present

## 2021-01-17 DIAGNOSIS — Z9114 Patient's other noncompliance with medication regimen: Secondary | ICD-10-CM | POA: Diagnosis not present

## 2021-01-17 DIAGNOSIS — Z87891 Personal history of nicotine dependence: Secondary | ICD-10-CM | POA: Diagnosis not present

## 2021-01-17 DIAGNOSIS — R200 Anesthesia of skin: Secondary | ICD-10-CM | POA: Diagnosis not present

## 2021-01-17 DIAGNOSIS — R531 Weakness: Secondary | ICD-10-CM | POA: Diagnosis present

## 2021-01-17 DIAGNOSIS — G459 Transient cerebral ischemic attack, unspecified: Secondary | ICD-10-CM | POA: Diagnosis not present

## 2021-01-17 HISTORY — DX: Hypertensive urgency: I16.0

## 2021-01-17 LAB — COMPREHENSIVE METABOLIC PANEL
ALT: 30 U/L (ref 0–44)
AST: 39 U/L (ref 15–41)
Albumin: 3.7 g/dL (ref 3.5–5.0)
Alkaline Phosphatase: 76 U/L (ref 38–126)
Anion gap: 5 (ref 5–15)
BUN: 24 mg/dL — ABNORMAL HIGH (ref 8–23)
CO2: 26 mmol/L (ref 22–32)
Calcium: 8.7 mg/dL — ABNORMAL LOW (ref 8.9–10.3)
Chloride: 112 mmol/L — ABNORMAL HIGH (ref 98–111)
Creatinine, Ser: 2.13 mg/dL — ABNORMAL HIGH (ref 0.61–1.24)
GFR, Estimated: 34 mL/min — ABNORMAL LOW (ref >60)
Glucose, Bld: 99 mg/dL (ref 70–99)
Potassium: 4.5 mmol/L (ref 3.5–5.1)
Sodium: 143 mmol/L (ref 135–145)
Total Bilirubin: 0.6 mg/dL (ref 0.3–1.2)
Total Protein: 7.5 g/dL (ref 6.5–8.1)

## 2021-01-17 LAB — RAPID URINE DRUG SCREEN, HOSP PERFORMED
Amphetamines: NOT DETECTED
Barbiturates: NOT DETECTED
Benzodiazepines: NOT DETECTED
Cocaine: NOT DETECTED
Opiates: NOT DETECTED
Tetrahydrocannabinol: NOT DETECTED

## 2021-01-17 LAB — RESP PANEL BY RT-PCR (FLU A&B, COVID) ARPGX2
Influenza A by PCR: NEGATIVE
Influenza B by PCR: NEGATIVE
SARS Coronavirus 2 by RT PCR: NEGATIVE

## 2021-01-17 LAB — URINALYSIS, ROUTINE W REFLEX MICROSCOPIC
Bacteria, UA: NONE SEEN
Bilirubin Urine: NEGATIVE
Glucose, UA: 50 mg/dL — AB
Ketones, ur: NEGATIVE mg/dL
Leukocytes,Ua: NEGATIVE
Nitrite: NEGATIVE
Protein, ur: NEGATIVE mg/dL
Specific Gravity, Urine: 1.01 (ref 1.005–1.030)
pH: 6 (ref 5.0–8.0)

## 2021-01-17 LAB — CBC
HCT: 45.2 % (ref 39.0–52.0)
Hemoglobin: 14.3 g/dL (ref 13.0–17.0)
MCH: 30.7 pg (ref 26.0–34.0)
MCHC: 31.6 g/dL (ref 30.0–36.0)
MCV: 97 fL (ref 80.0–100.0)
Platelets: 138 10*3/uL — ABNORMAL LOW (ref 150–400)
RBC: 4.66 MIL/uL (ref 4.22–5.81)
RDW: 13.8 % (ref 11.5–15.5)
WBC: 7.2 10*3/uL (ref 4.0–10.5)
nRBC: 0 % (ref 0.0–0.2)

## 2021-01-17 LAB — DIFFERENTIAL
Abs Immature Granulocytes: 0.02 10*3/uL (ref 0.00–0.07)
Basophils Absolute: 0 10*3/uL (ref 0.0–0.1)
Basophils Relative: 0 %
Eosinophils Absolute: 0.2 10*3/uL (ref 0.0–0.5)
Eosinophils Relative: 3 %
Immature Granulocytes: 0 %
Lymphocytes Relative: 28 %
Lymphs Abs: 2 10*3/uL (ref 0.7–4.0)
Monocytes Absolute: 0.7 10*3/uL (ref 0.1–1.0)
Monocytes Relative: 9 %
Neutro Abs: 4.3 10*3/uL (ref 1.7–7.7)
Neutrophils Relative %: 60 %

## 2021-01-17 LAB — ETHANOL: Alcohol, Ethyl (B): <10 mg/dL (ref <10)

## 2021-01-17 LAB — BRAIN NATRIURETIC PEPTIDE: B Natriuretic Peptide: 165.3 pg/mL — ABNORMAL HIGH (ref 0.0–100.0)

## 2021-01-17 LAB — PROTIME-INR
INR: 0.9 (ref 0.8–1.2)
Prothrombin Time: 12.6 seconds (ref 11.4–15.2)

## 2021-01-17 LAB — APTT: aPTT: 28 seconds (ref 24–36)

## 2021-01-17 IMAGING — MR MR HEAD W/O CM
8 series · 46 of 48 positions shown · non-contrast
Comparison: Head CT [DATE]

CLINICAL DATA: Left-sided numbness/tingling for 7 days with
weakness.

EXAM:
MRI HEAD WITHOUT CONTRAST
TECHNIQUE: Multiplanar, multiecho pulse sequences of the brain and surrounding
structures were obtained without intravenous contrast.

[Series 5: dwi_tracew · axial · 3.0mm · 1.08mm/px · z∈[-32,+120]mm · 10 of 108 slices shown]
[im 1/108]
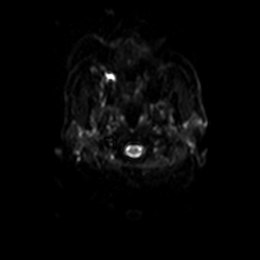
[im 10/108]
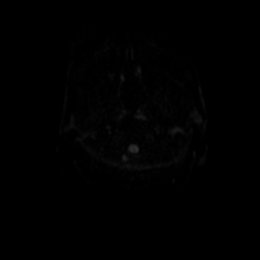
[im 20/108]
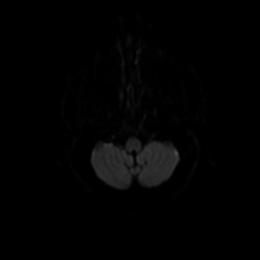
[im 30/108]
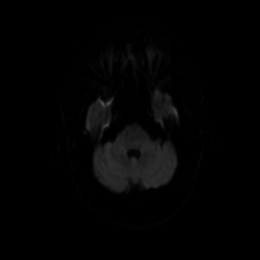
[im 39/108]
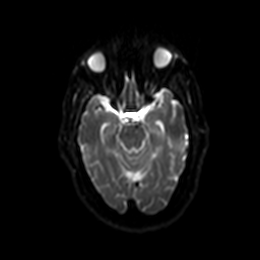
[im 49/108]
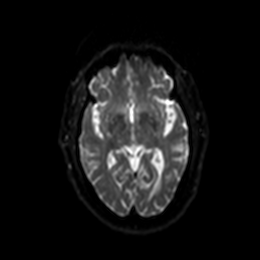
[im 59/108]
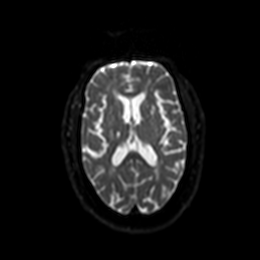
[im 78/108]
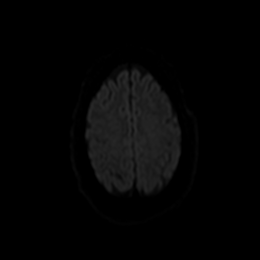
[im 88/108]
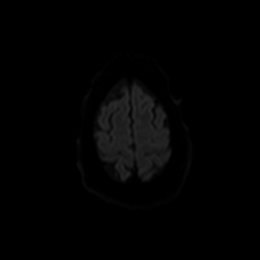
[im 108/108]
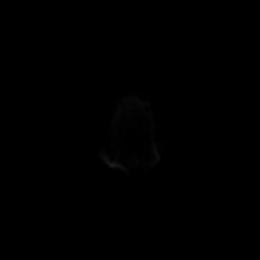

[Series 6: dwi_adc · axial · 3.0mm · 1.08mm/px · z∈[-32,+120]mm · 6 of 54 slices shown]
[im 1/54]
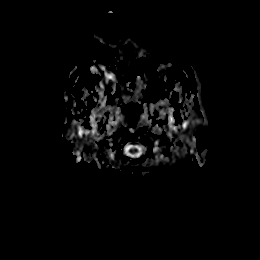
[im 11/54]
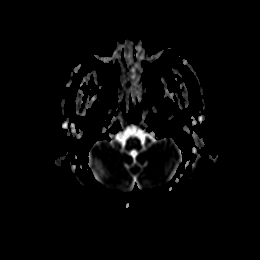
[im 22/54]
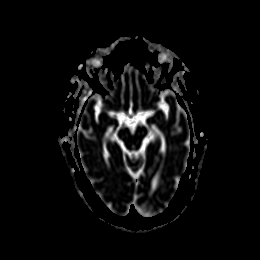
[im 32/54]
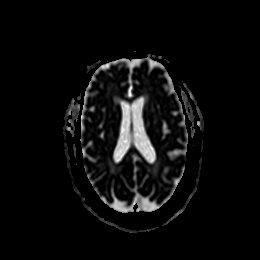
[im 43/54]
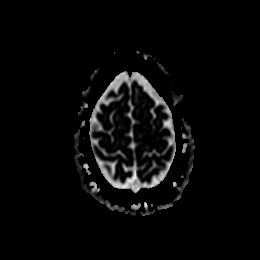
[im 54/54]
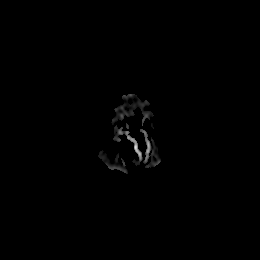

[Series 7: T2 · sagittal · 5.0mm · 0.47mm/px · 3 of 24 slices shown (1 of 2)]
[im 1/24]
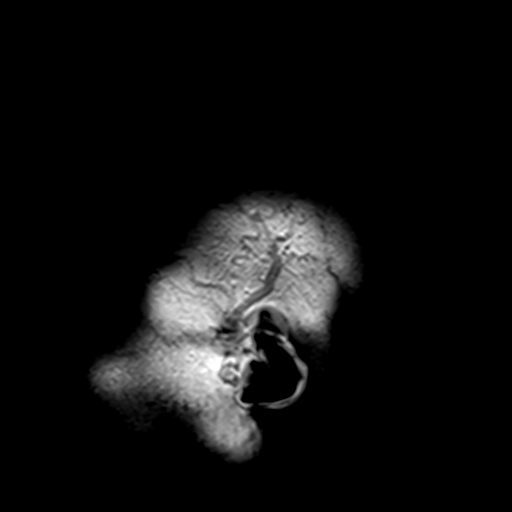
[im 12/24]
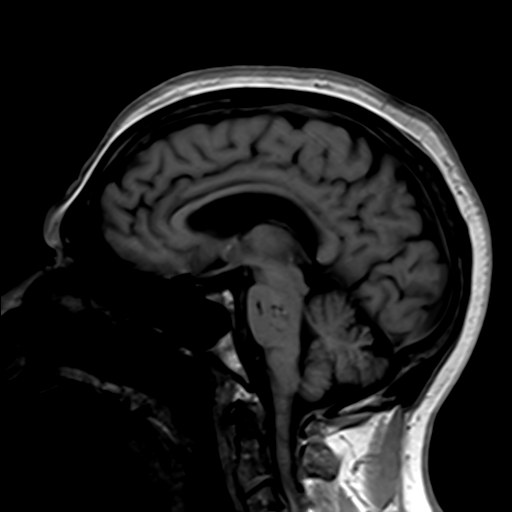
[im 24/24]
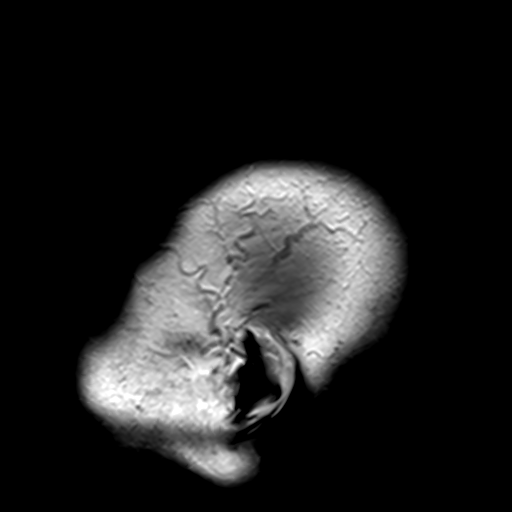

[Series 8: DWI · coronal · 3.5mm · 1.31mm/px · 8 of 72 slices shown (1 of 2)]
[im 1/72]
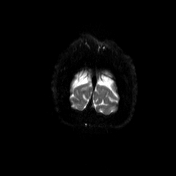
[im 11/72]
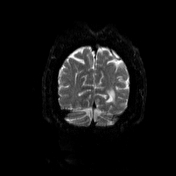
[im 21/72]
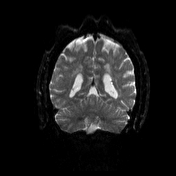
[im 31/72]
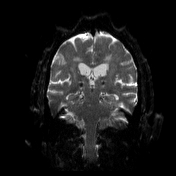
[im 41/72]
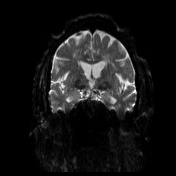
[im 51/72]
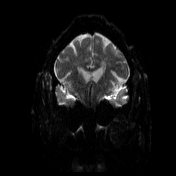
[im 61/72]
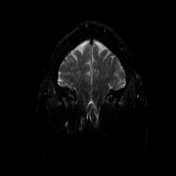
[im 72/72]
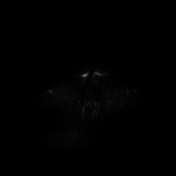

[Series 9: DWI · coronal · 3.5mm · 1.31mm/px · 4 of 36 slices shown (2 of 2)]
[im 1/36]
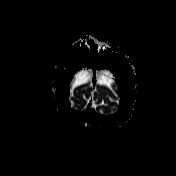
[im 12/36]
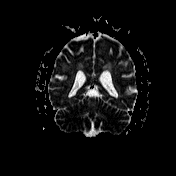
[im 24/36]
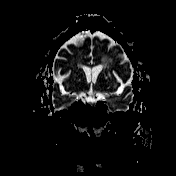
[im 36/36]
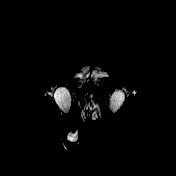

[Series 10: T2 · axial · 5.0mm · 0.45mm/px · z∈[-52,+107]mm · 3 of 26 slices shown (2 of 2)]
[im 1/26]
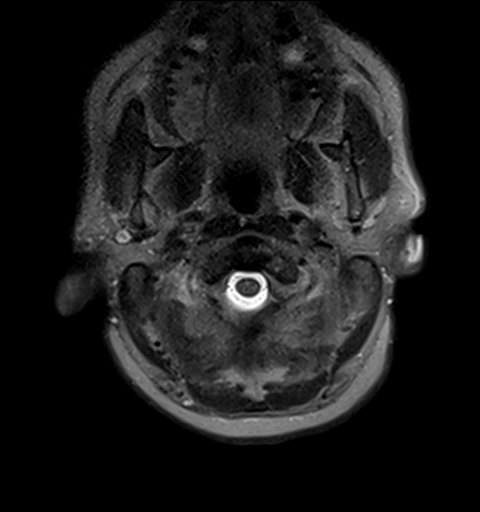
[im 13/26]
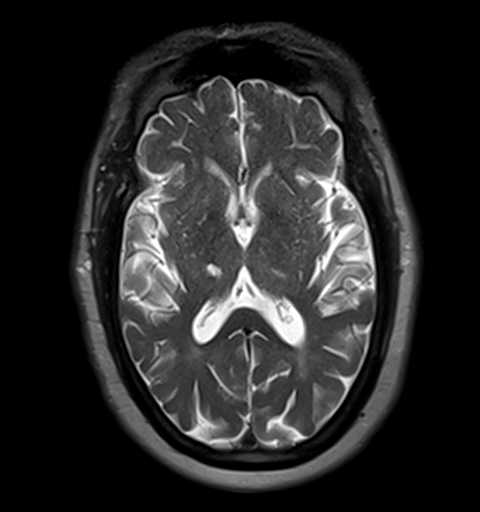
[im 26/26]
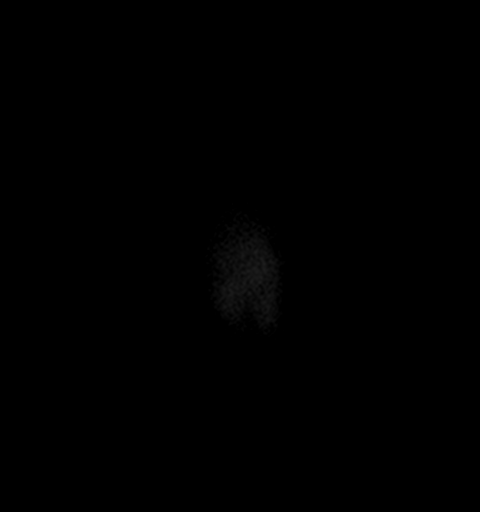

[Series 11: GRE · axial · 3.0mm · 0.45mm/px · z∈[-41,+111]mm · 6 of 54 slices shown]
[im 1/54]
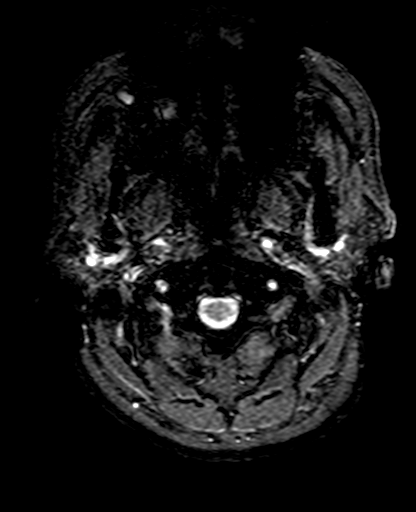
[im 11/54]
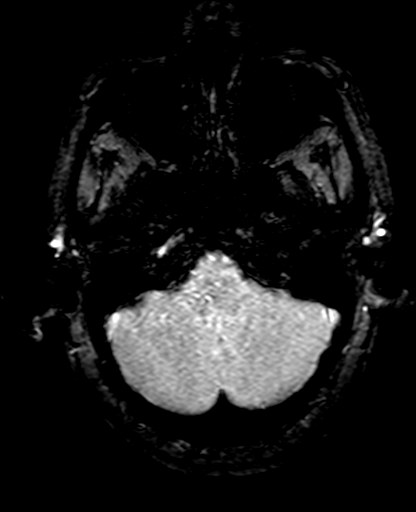
[im 22/54]
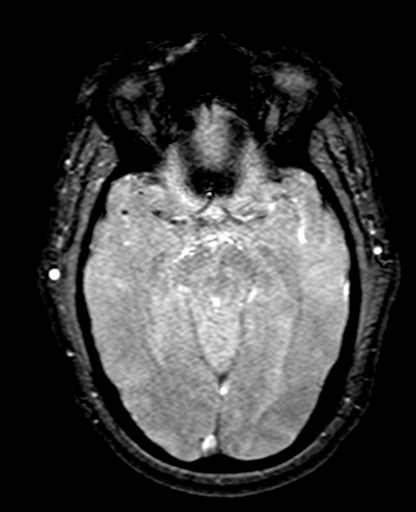
[im 32/54]
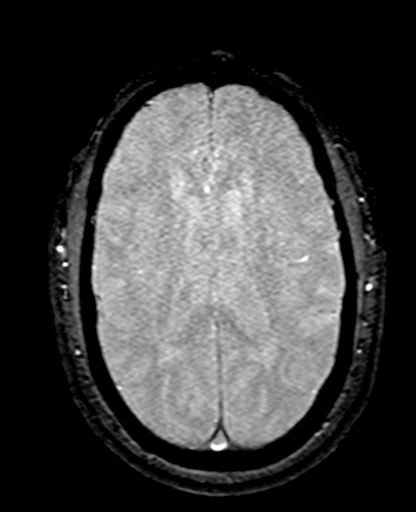
[im 43/54]
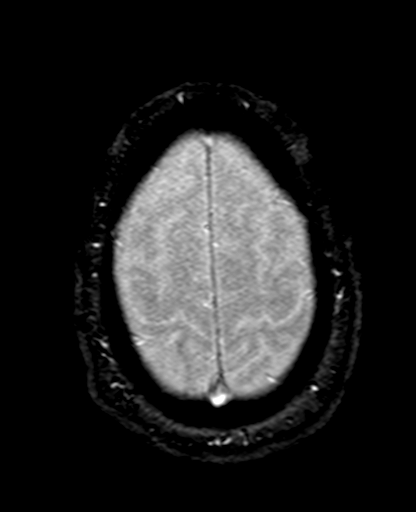
[im 54/54]
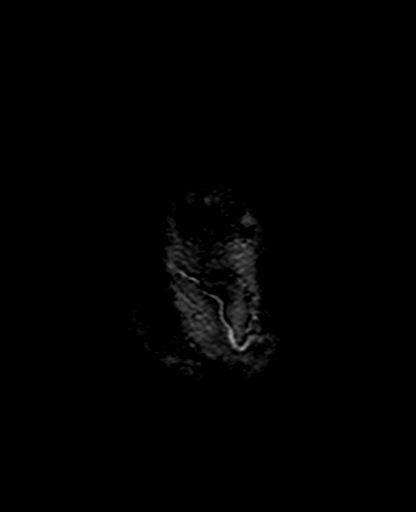

[Series 12: FLAIR · axial · 3.0mm · 0.86mm/px · z∈[-56,+106]mm · 6 of 56 slices shown]
[im 1/56]
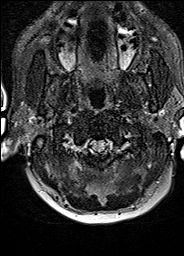
[im 12/56]
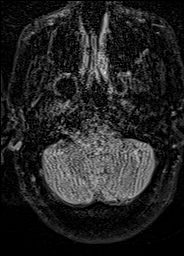
[im 23/56]
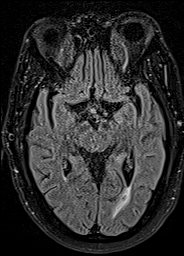
[im 34/56]
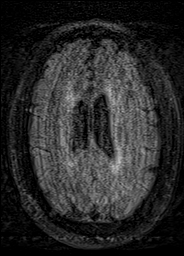
[im 45/56]
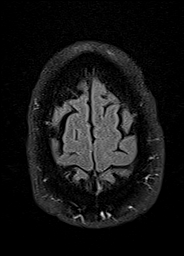
[im 56/56]
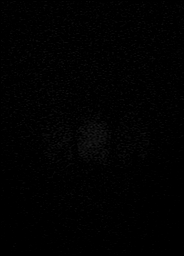

[46 of 48 positions shown; findings below may reference images not displayed]

FINDINGS: The patient was unable to tolerate the complete examination. Axial
and coronal diffusion, sagittal T1, axial FLAIR, axial T2, and axial
T2* gradient echo sequences were obtained. The study is motion
degraded including severe motion on the FLAIR sequence.

Brain: There is no evidence of an acute infarct, mass, midline
shift, or extra-axial fluid collection. There are chronic lacunar
infarcts with associated chronic blood products in the thalami
(right larger than left) and right paramedian pons. Additional
chronic microhemorrhages are noted deep in the cerebral hemispheres
and brainstem suggesting chronic hypertension. Patchy T2
hyperintensities in the cerebral white matter bilaterally are
nonspecific but compatible with moderately age advanced chronic
small vessel ischemic disease. There is mild cerebral atrophy.

Vascular: Major intracranial vascular flow voids are preserved.

Skull and upper cervical spine: No suspicious marrow lesion.

Sinuses/Orbits: Unremarkable orbits. Small mucous retention cyst in
the right maxillary sinus. Clear mastoid air cells.

Other: None.
IMPRESSION: 1. Motion degraded, incomplete examination.
2. No acute infarct.
3. Moderately age advanced chronic small vessel ischemic disease
with chronic lacunar infarcts and chronic microhemorrhages as above.

## 2021-01-17 IMAGING — MR MR MRA HEAD W/O CM
2 series · 20 of 48 positions shown · non-contrast
Comparison: None.

CLINICAL DATA: Left-sided weakness

EXAM:
MRA HEAD WITHOUT CONTRAST
MRA NECK WITHOUT CONTRAST
TECHNIQUE: Angiographic images of the Circle of Willis were obtained using MRA
technique without intravenous contrast. Angiographic images of the
neck were obtained using MRA technique without intravenous contrast.
Carotid stenosis measurements (when applicable) are obtained
utilizing NASCET criteria, using the distal internal carotid
diameter as the denominator.

[Series 5: vessel_scout_head_msum · sagittal · 6.0mm · 0.59mm/px · 6 of 24 slices shown]
[im 1/24]
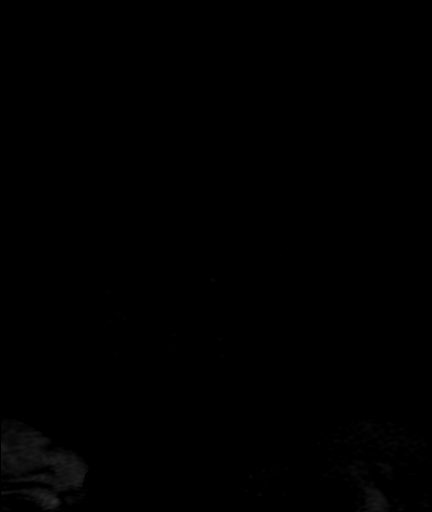
[im 5/24]
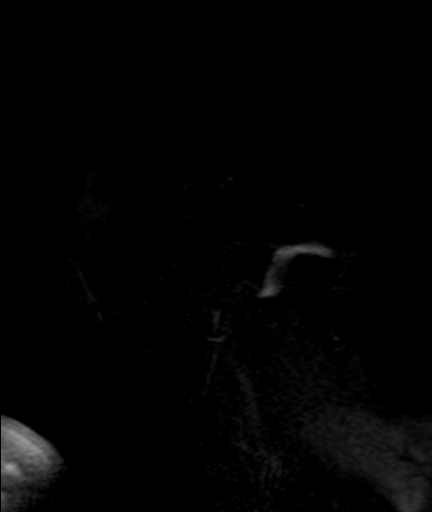
[im 10/24]
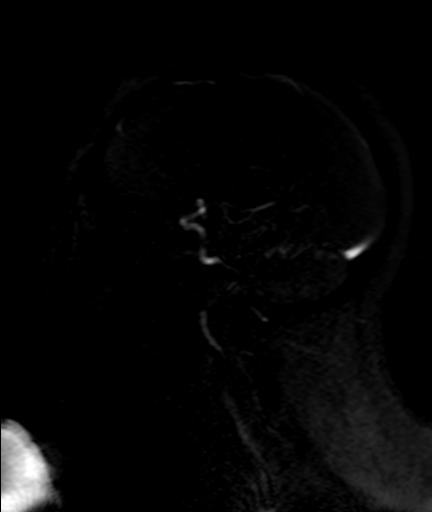
[im 14/24]
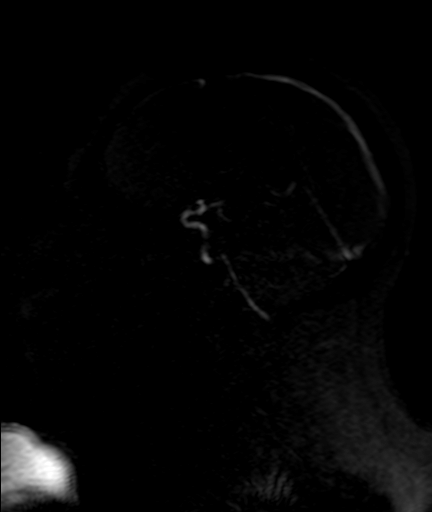
[im 19/24]
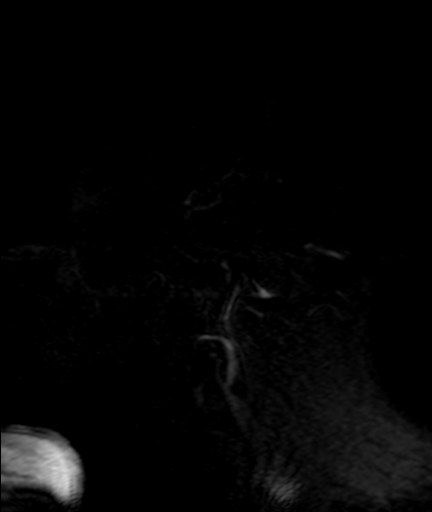
[im 24/24]
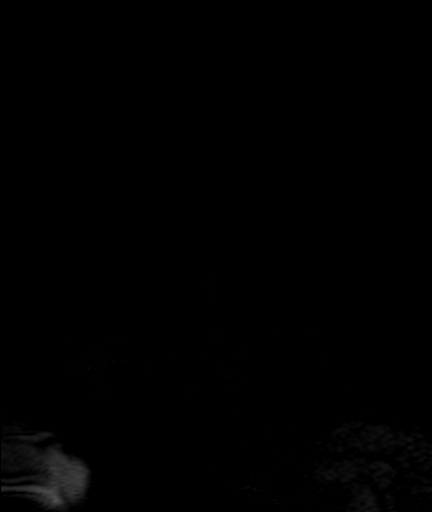

[Series 8: TOF · axial · 0.6mm · 0.35mm/px · z∈[-18,+74]mm · 14 of 172 slices shown]
[im 1/172]
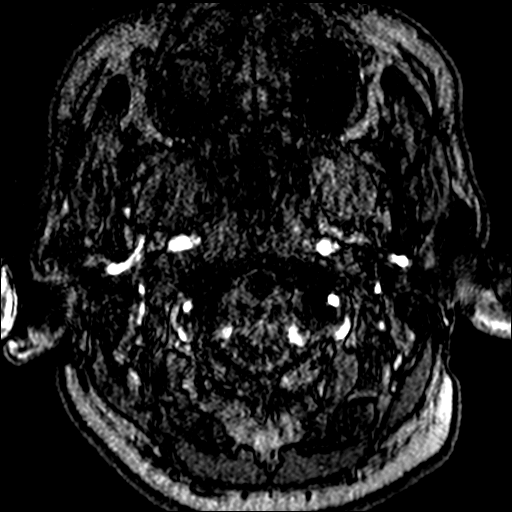
[im 5/172]
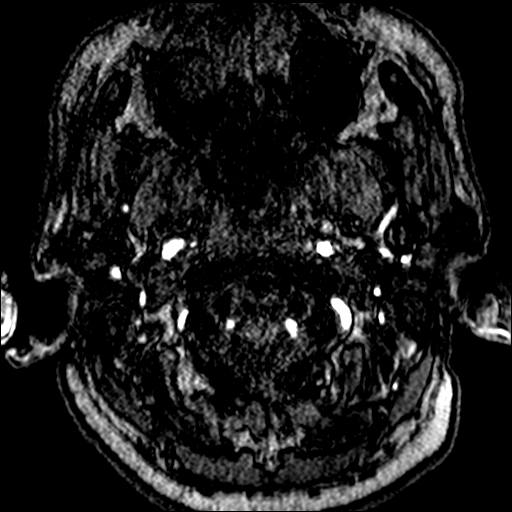
[im 9/172]
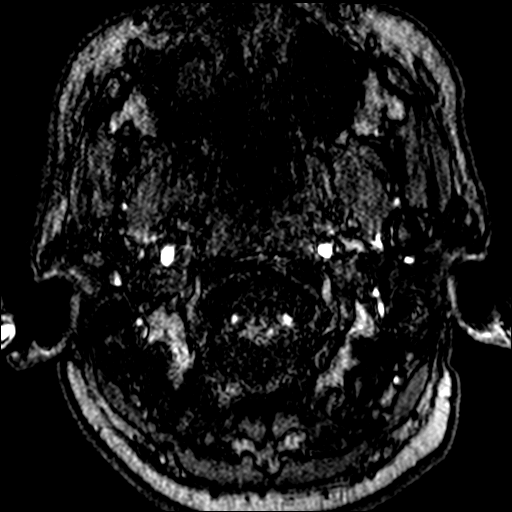
[im 13/172]
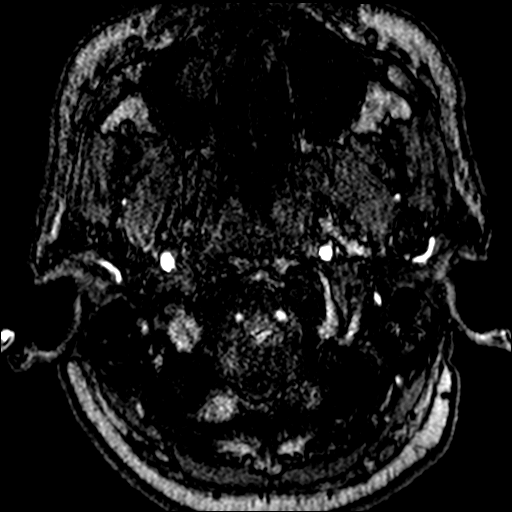
[im 17/172]
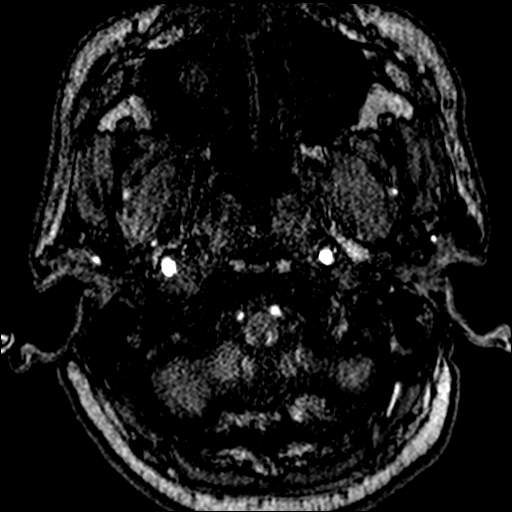
[im 30/172]
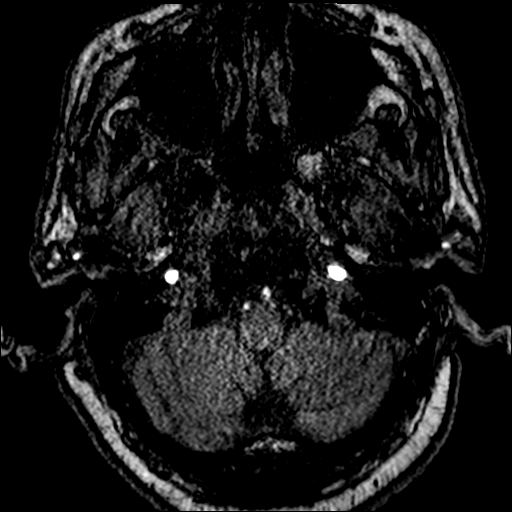
[im 55/172]
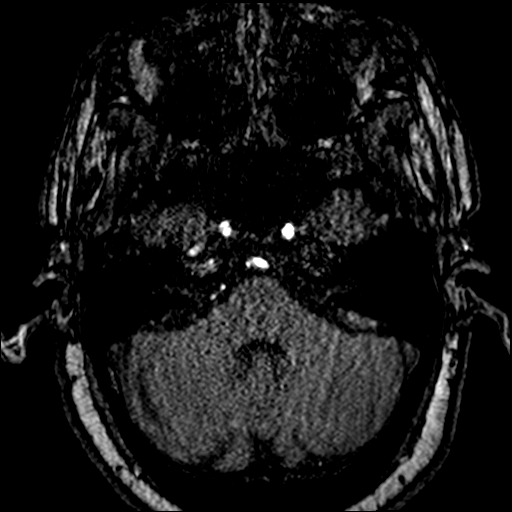
[im 76/172]
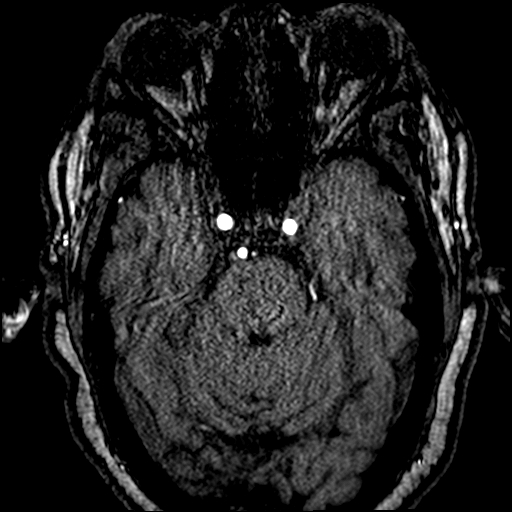
[im 88/172]
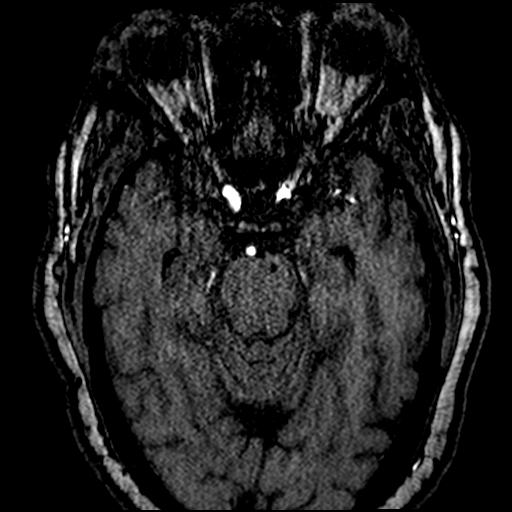
[im 96/172]
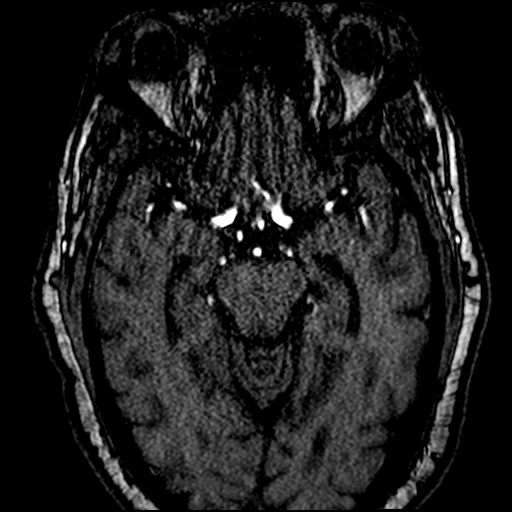
[im 117/172]
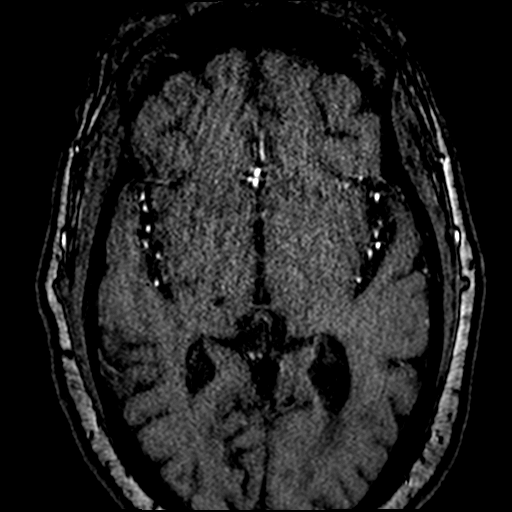
[im 142/172]
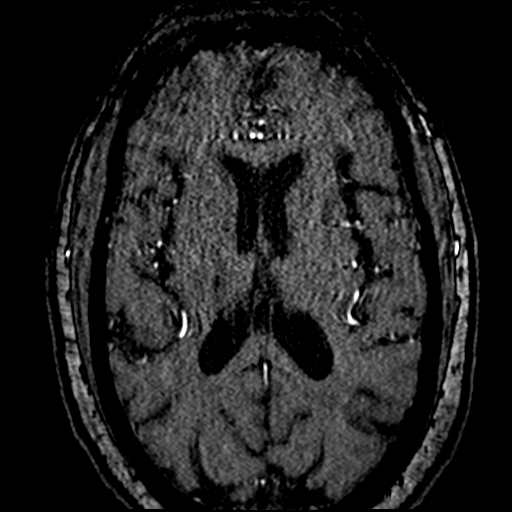
[im 146/172]
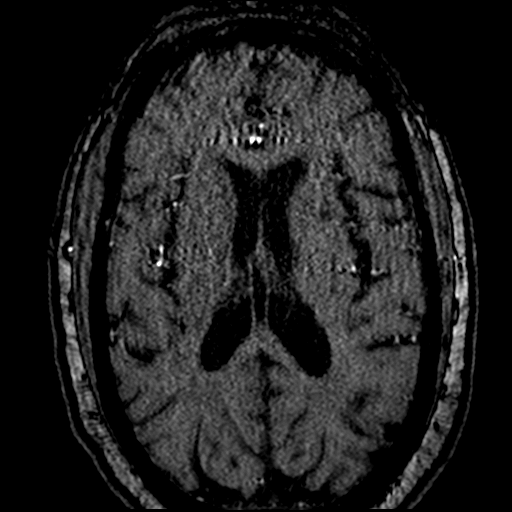
[im 163/172]
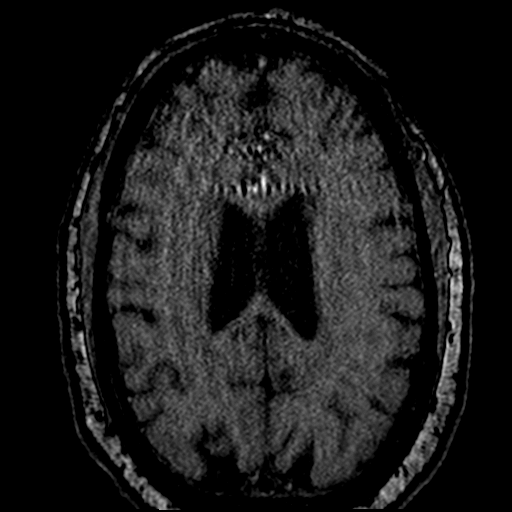

[20 of 48 positions shown; findings below may reference images not displayed]

FINDINGS: MRA HEAD FINDINGS

Intracranial internal carotid arteries are patent. Middle and
anterior cerebral arteries are patent. Possible marked focal right
M1 MCA stenosis. Intracranial vertebral arteries, basilar artery,
posterior cerebral arteries are patent. Bilateral posterior
communicating arteries are present. There is no aneurysm.

MRA NECK FINDINGS

Common, internal, and external carotid arteries are patent
extracranial vertebral arteries are patent. No hemodynamically
significant stenosis or evidence of dissection.
IMPRESSION: No proximal intracranial vessel occlusion. Possible marked stenosis
of the mid right M1 MCA (could be exaggerated by artifact).

No occlusion or hemodynamically significant stenosis in the neck.

## 2021-01-17 IMAGING — MR MR MRA NECK W/O CM
2 of 4 series · 19 of 48 positions shown · non-contrast
Comparison: None.

CLINICAL DATA: Left-sided weakness

EXAM:
MRA HEAD WITHOUT CONTRAST
MRA NECK WITHOUT CONTRAST
TECHNIQUE: Angiographic images of the Circle of Willis were obtained using MRA
technique without intravenous contrast. Angiographic images of the
neck were obtained using MRA technique without intravenous contrast.
Carotid stenosis measurements (when applicable) are obtained
utilizing NASCET criteria, using the distal internal carotid
diameter as the denominator.

[Series 16: tof_2d_tra · axial · 3.5mm · 0.43mm/px · z∈[-232,-31]mm · 18 of 87 slices shown]
[im 1/87]
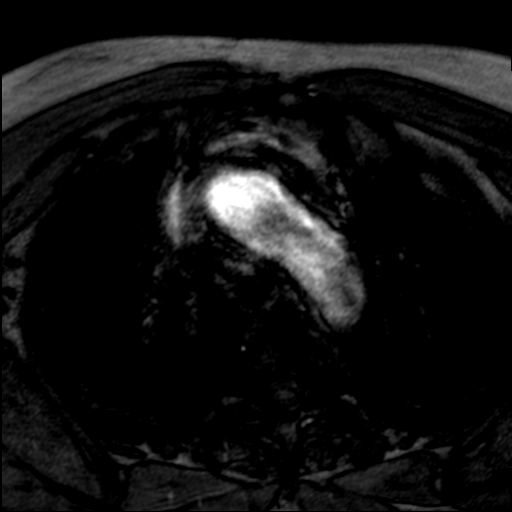
[im 2/87]
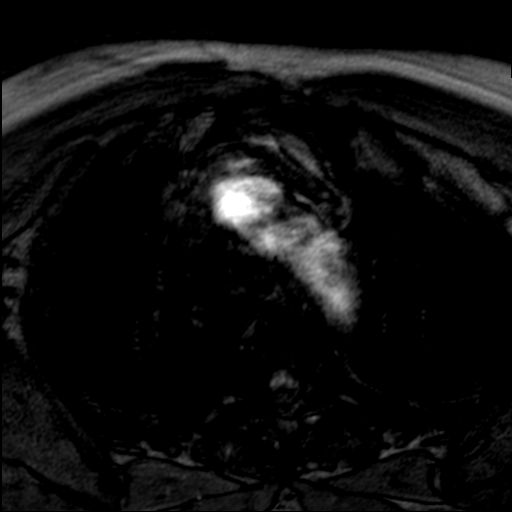
[im 4/87]
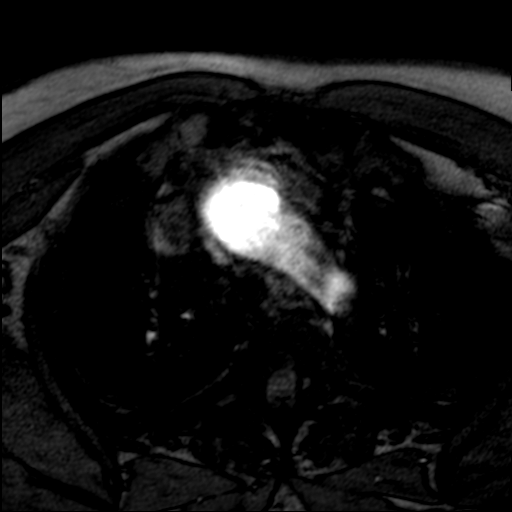
[im 6/87]
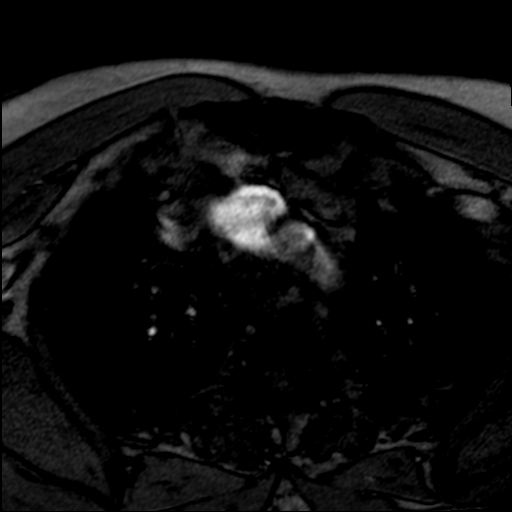
[im 8/87]
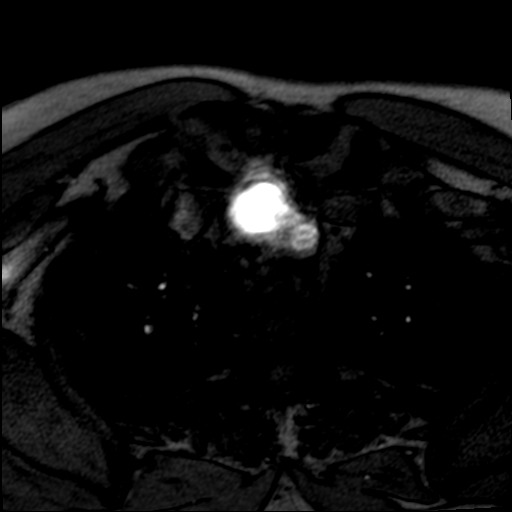
[im 10/87]
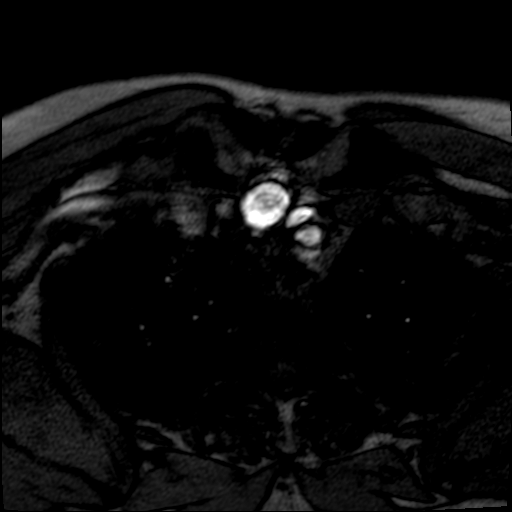
[im 12/87]
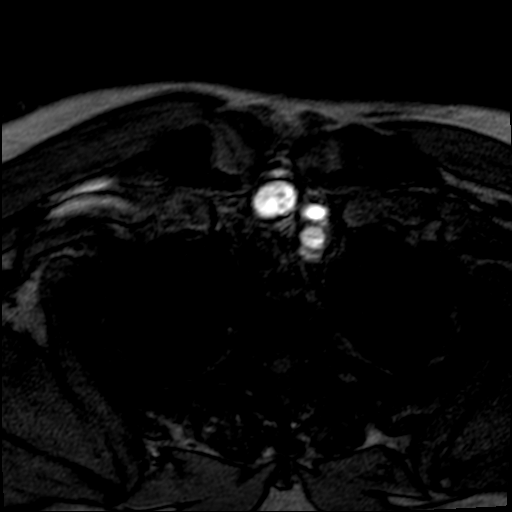
[im 14/87]
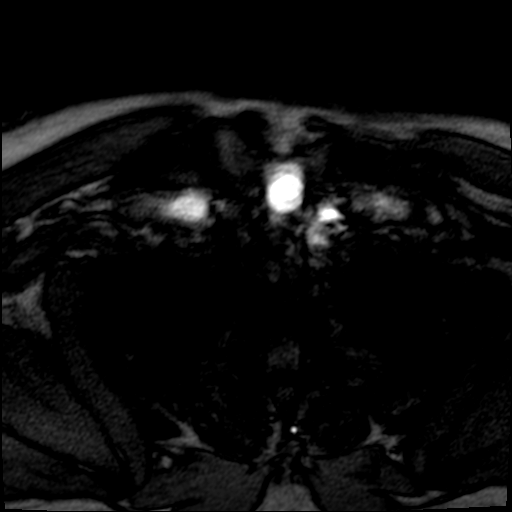
[im 16/87]
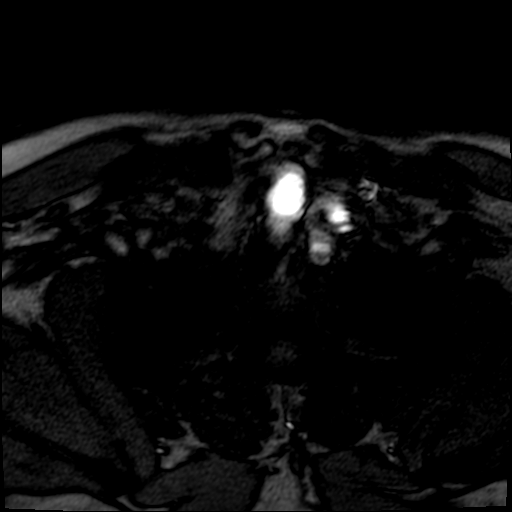
[im 18/87]
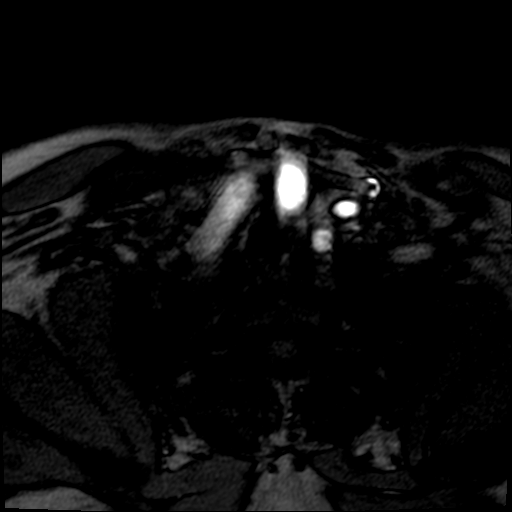
[im 28/87]
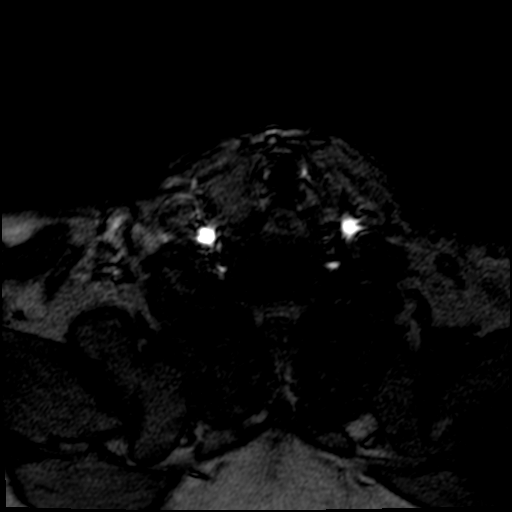
[im 38/87]
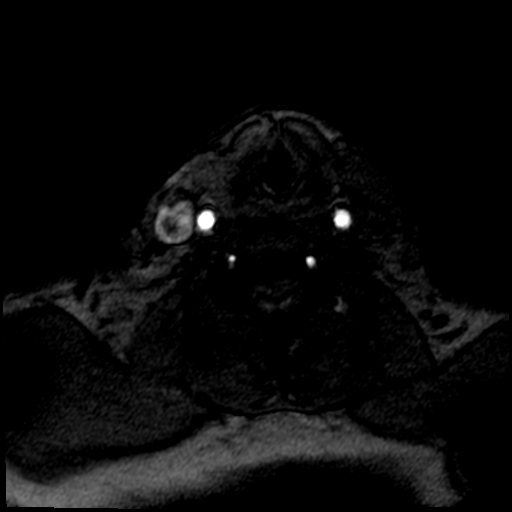
[im 44/87]
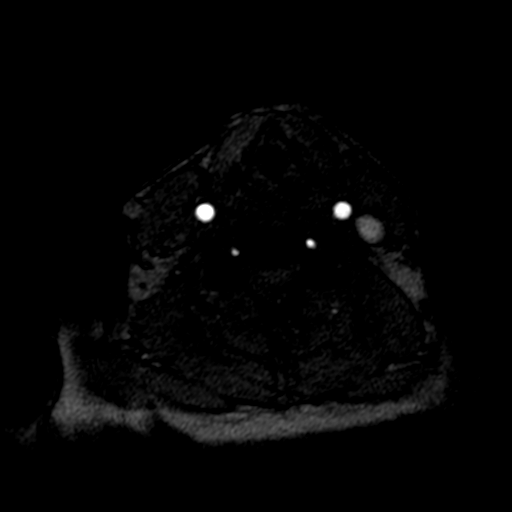
[im 49/87]
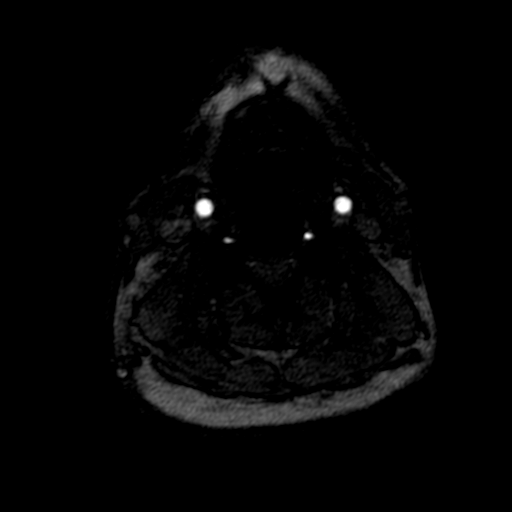
[im 59/87]
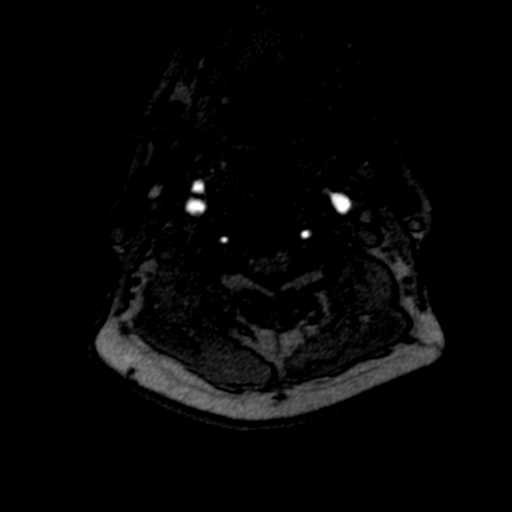
[im 71/87]
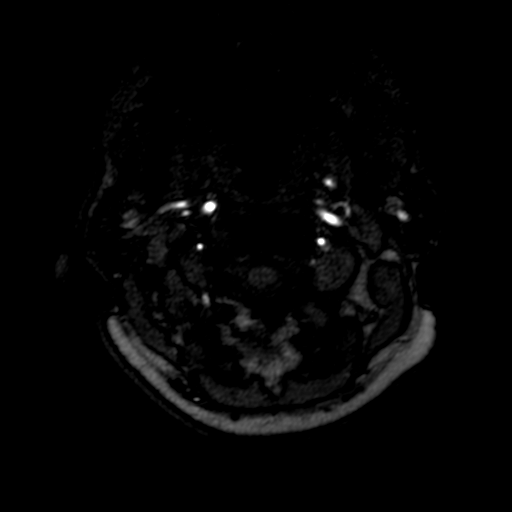
[im 73/87]
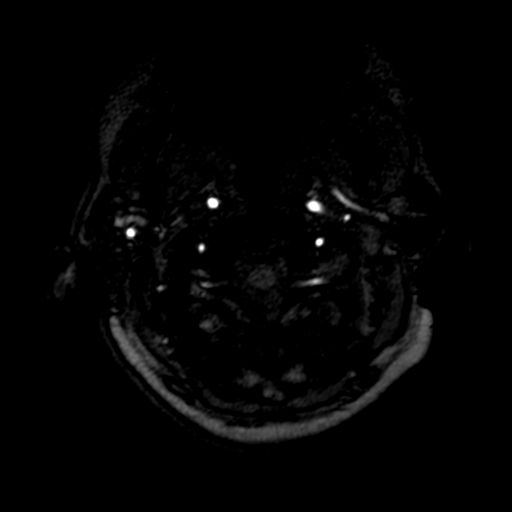
[im 83/87]
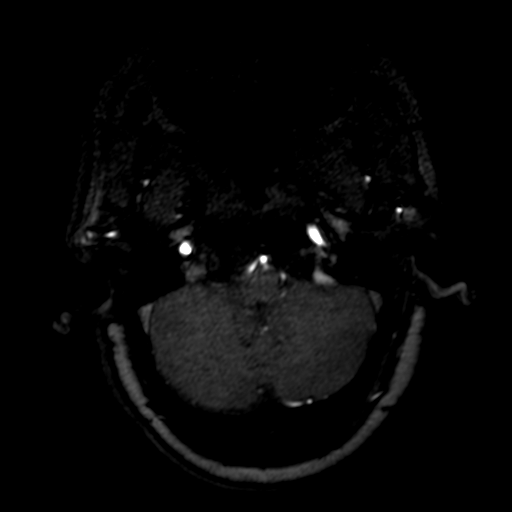

[Series 19: tof_2d_tra_mip_tra · axial · 214.2mm · 0.43mm/px · 1 of 1 slices shown]
[im 1/1]
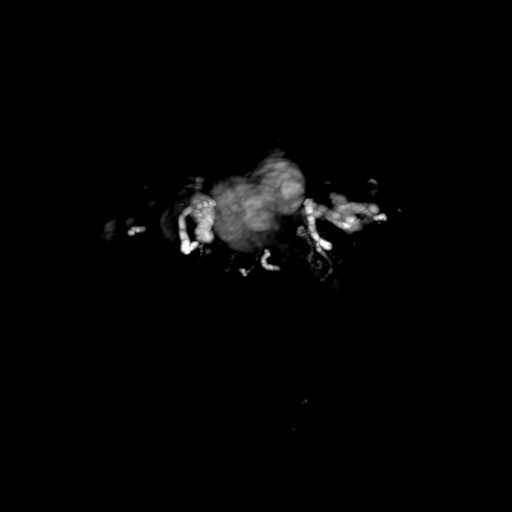

[19 of 48 positions shown; findings below may reference images not displayed]

FINDINGS: MRA HEAD FINDINGS

Intracranial internal carotid arteries are patent. Middle and
anterior cerebral arteries are patent. Possible marked focal right
M1 MCA stenosis. Intracranial vertebral arteries, basilar artery,
posterior cerebral arteries are patent. Bilateral posterior
communicating arteries are present. There is no aneurysm.

MRA NECK FINDINGS

Common, internal, and external carotid arteries are patent
extracranial vertebral arteries are patent. No hemodynamically
significant stenosis or evidence of dissection.
IMPRESSION: No proximal intracranial vessel occlusion. Possible marked stenosis
of the mid right M1 MCA (could be exaggerated by artifact).

No occlusion or hemodynamically significant stenosis in the neck.

## 2021-01-17 IMAGING — CT CT HEAD W/O CM
3 series · 15 of 47 positions shown, 18 images · non-contrast
Comparison: None.

CLINICAL DATA: Left-sided weakness

EXAM:
CT HEAD WITHOUT CONTRAST
TECHNIQUE: Contiguous axial images were obtained from the base of the skull
through the vertex without intravenous contrast.

[Series 2: head wo · axial · 0.47mm/px · z∈[-156,-26]mm · 9 of 32 slices shown, 12 images]
[im 3/32  brain]
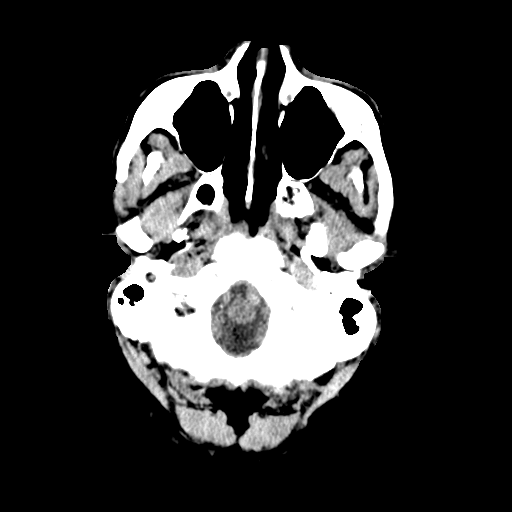
[im 3/32  bone]
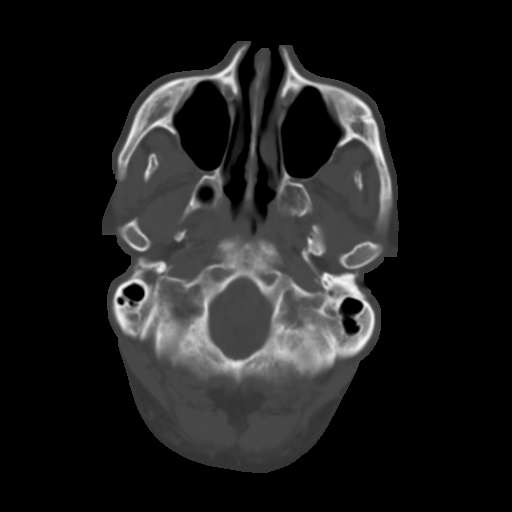
[im 6/32  brain]
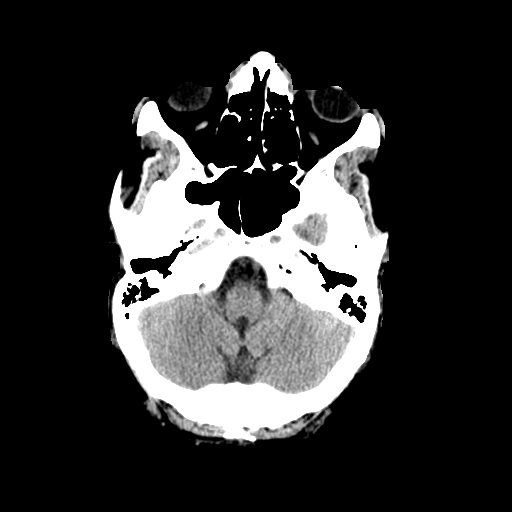
[im 9/32  brain]
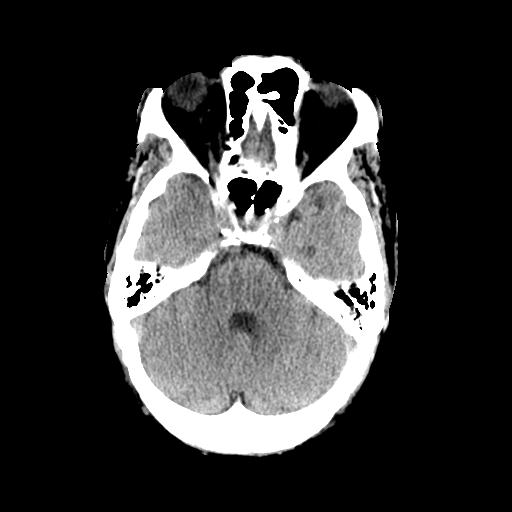
[im 12/32  brain]
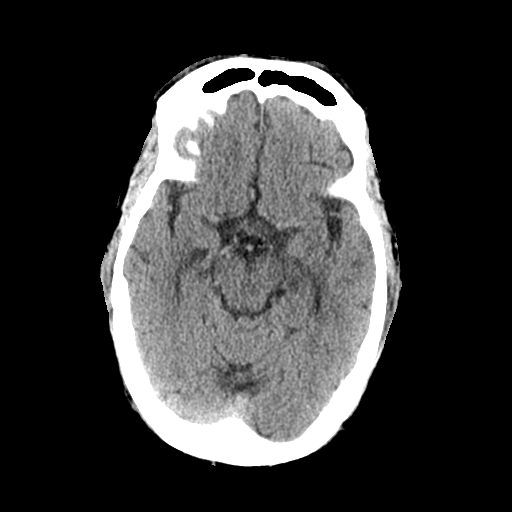
[im 17/32  brain]
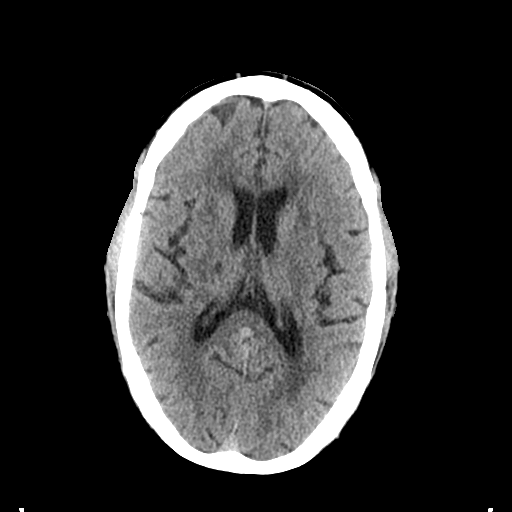
[im 17/32  bone]
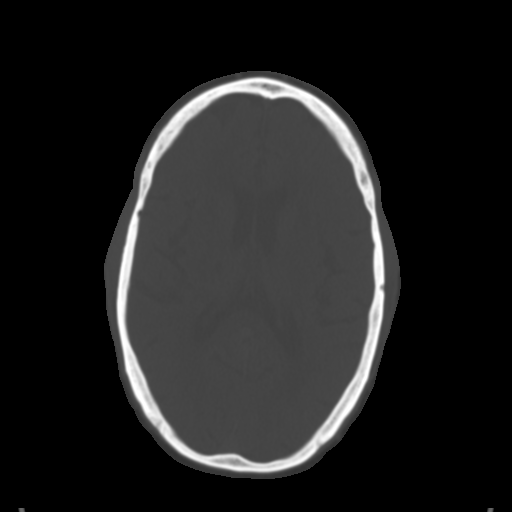
[im 20/32  brain]
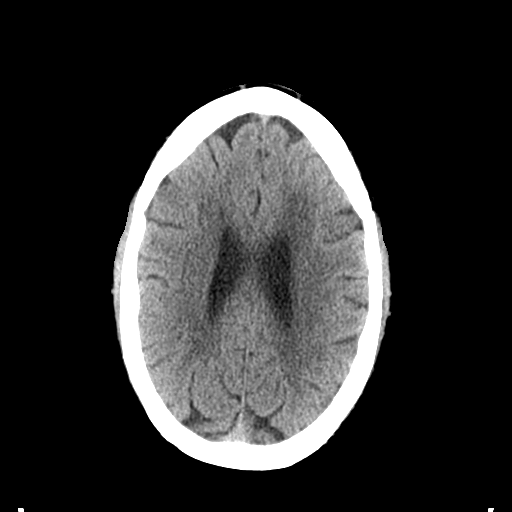
[im 23/32  brain]
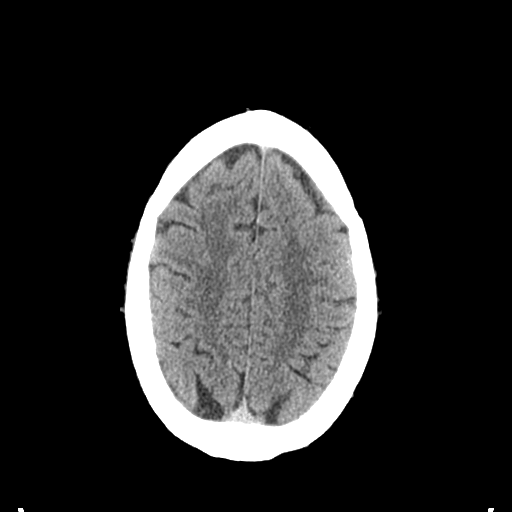
[im 26/32  brain]
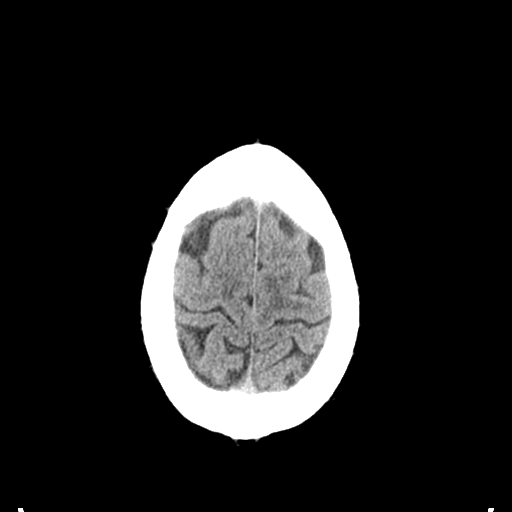
[im 29/32  brain]
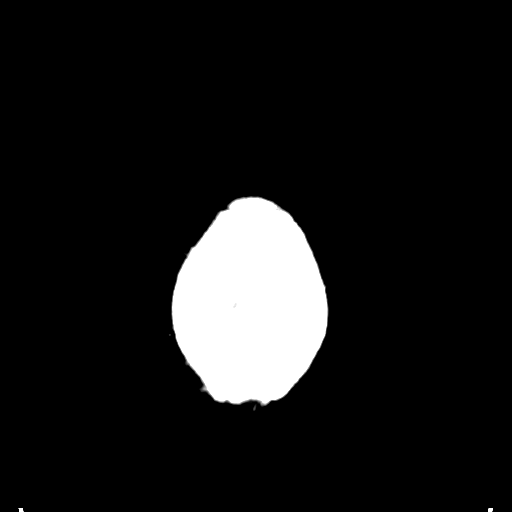
[im 29/32  bone]
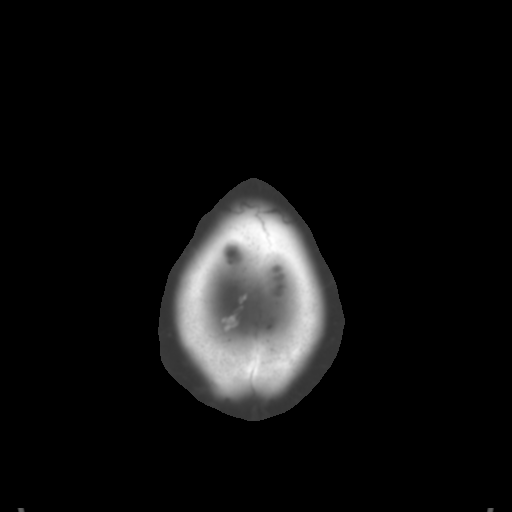

[Series 4: coronal soft tissue · coronal · 0.29mm/px · 3 of 68 slices shown]
[im 23/68  brain]
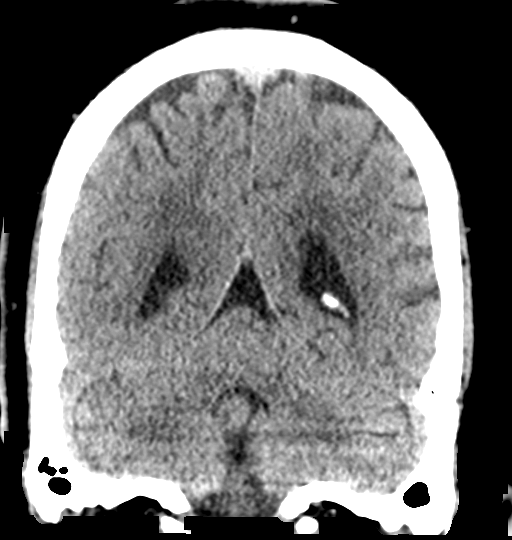
[im 30/68  brain]
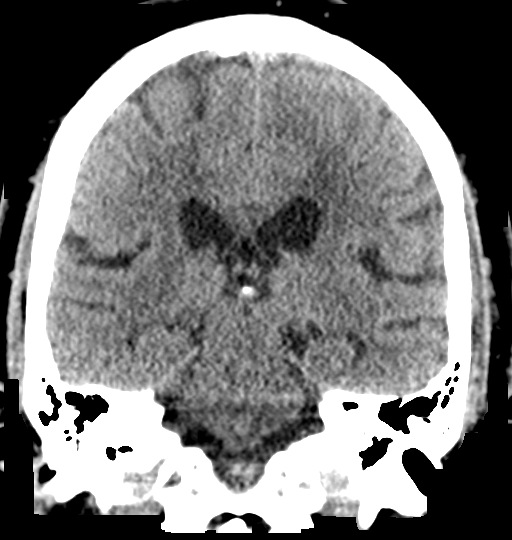
[im 38/68  brain]
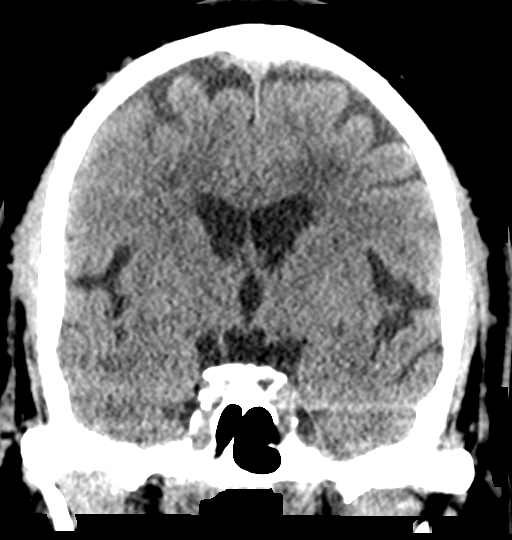

[Series 5: sagittal soft tissue · sagittal · 0.30mm/px · 3 of 46 slices shown]
[im 16/46  brain]
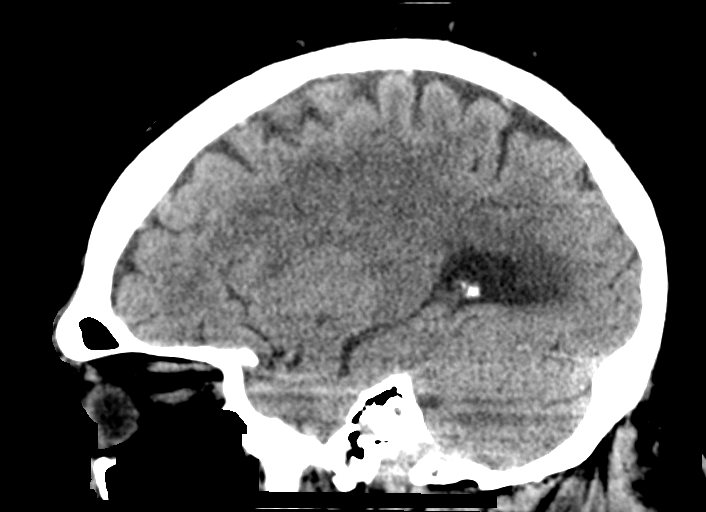
[im 23/46  brain]
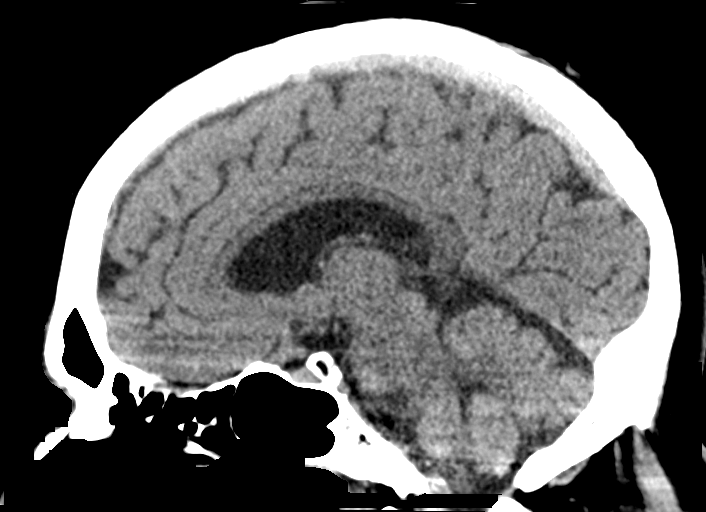
[im 31/46  brain]
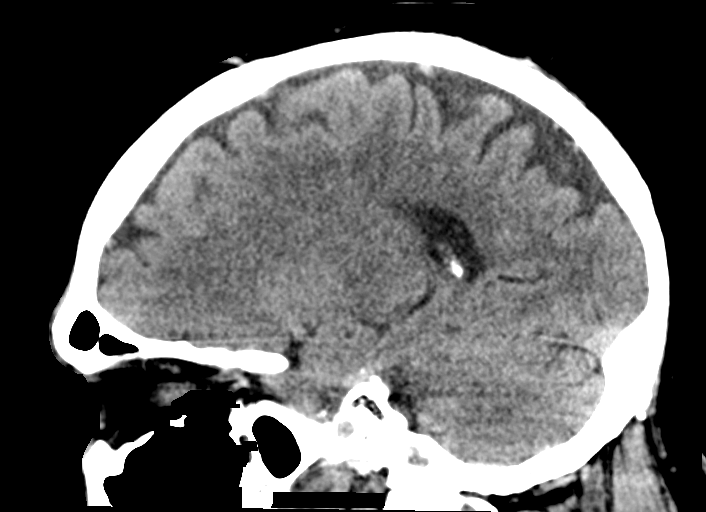

[15 of 47 positions shown; findings below may reference images not displayed]

FINDINGS: Brain: Old right thalamic lacunar infarct. Chronic small vessel
disease throughout the deep white matter. No acute intracranial
abnormality. Specifically, no hemorrhage, hydrocephalus, mass
lesion, acute infarction, or significant intracranial injury.

Vascular: No hyperdense vessel or unexpected calcification.

Skull: No acute calvarial abnormality.

Sinuses/Orbits: No acute findings

Other: None
IMPRESSION: Chronic small vessel disease throughout the deep white matter.

Old right thalamic lacunar infarct.

No acute intracranial abnormality.

## 2021-01-17 MED ORDER — IRBESARTAN 150 MG PO TABS
150.0000 mg | ORAL_TABLET | Freq: Once | ORAL | Status: AC
  Administered 2021-01-17: 150 mg via ORAL
  Filled 2021-01-17: qty 1

## 2021-01-17 MED ORDER — FUROSEMIDE 40 MG PO TABS
40.0000 mg | ORAL_TABLET | Freq: Two times a day (BID) | ORAL | Status: DC
  Administered 2021-01-17 – 2021-01-20 (×6): 40 mg via ORAL
  Filled 2021-01-17 (×6): qty 1

## 2021-01-17 MED ORDER — ACETAMINOPHEN 650 MG RE SUPP: 650.0000 mg | RECTAL | Status: DC | PRN

## 2021-01-17 MED ORDER — ASPIRIN EC 81 MG PO TBEC
81.0000 mg | DELAYED_RELEASE_TABLET | Freq: Every day | ORAL | Status: DC
  Administered 2021-01-17 – 2021-01-20 (×4): 81 mg via ORAL
  Filled 2021-01-17 (×4): qty 1

## 2021-01-17 MED ORDER — AMLODIPINE BESYLATE 5 MG PO TABS
5.0000 mg | ORAL_TABLET | Freq: Once | ORAL | Status: AC
  Administered 2021-01-17: 5 mg via ORAL
  Filled 2021-01-17: qty 1

## 2021-01-17 MED ORDER — ACETAMINOPHEN 160 MG/5ML PO SOLN: 650.0000 mg | ORAL | Status: DC | PRN

## 2021-01-17 MED ORDER — AMLODIPINE BESYLATE 5 MG PO TABS
5.0000 mg | ORAL_TABLET | Freq: Every day | ORAL | Status: DC
  Administered 2021-01-18 – 2021-01-20 (×3): 5 mg via ORAL
  Filled 2021-01-17 (×3): qty 1

## 2021-01-17 MED ORDER — ACETAMINOPHEN 325 MG PO TABS
650.0000 mg | ORAL_TABLET | ORAL | Status: DC | PRN
Start: 2021-01-17 — End: 2021-01-20
  Administered 2021-01-18 – 2021-01-19 (×2): 650 mg via ORAL
  Filled 2021-01-17 (×2): qty 2

## 2021-01-17 MED ORDER — GABAPENTIN 100 MG PO CAPS
100.0000 mg | ORAL_CAPSULE | Freq: Two times a day (BID) | ORAL | Status: DC
  Administered 2021-01-17 – 2021-01-19 (×4): 100 mg via ORAL
  Filled 2021-01-17 (×4): qty 1

## 2021-01-17 MED ORDER — SENNOSIDES-DOCUSATE SODIUM 8.6-50 MG PO TABS: 1.0000 | ORAL_TABLET | Freq: Every evening | ORAL | Status: DC | PRN

## 2021-01-17 MED ORDER — LORAZEPAM 2 MG/ML IJ SOLN
1.0000 mg | INTRAMUSCULAR | Status: AC
  Administered 2021-01-17: 1 mg via INTRAVENOUS
  Filled 2021-01-17: qty 1

## 2021-01-17 MED ORDER — HYDRALAZINE HCL 20 MG/ML IJ SOLN
10.0000 mg | Freq: Three times a day (TID) | INTRAMUSCULAR | Status: DC | PRN
  Administered 2021-01-17 – 2021-01-18 (×2): 10 mg via INTRAVENOUS
  Filled 2021-01-17 (×2): qty 1

## 2021-01-17 MED ORDER — IRBESARTAN 75 MG PO TABS
37.5000 mg | ORAL_TABLET | Freq: Every day | ORAL | Status: DC
  Administered 2021-01-17: 37.5 mg via ORAL
  Filled 2021-01-17: qty 0.5

## 2021-01-17 MED ORDER — IRBESARTAN 300 MG PO TABS
300.0000 mg | ORAL_TABLET | Freq: Every day | ORAL | Status: DC
  Administered 2021-01-18 – 2021-01-20 (×3): 300 mg via ORAL
  Filled 2021-01-17 (×3): qty 1

## 2021-01-17 MED ORDER — STROKE: EARLY STAGES OF RECOVERY BOOK
Freq: Once | Status: AC
  Filled 2021-01-17: qty 1

## 2021-01-17 NOTE — ED Notes (Signed)
Iv team to bedside at this time

## 2021-01-17 NOTE — Progress Notes (Signed)
ICU charge notified that pt needs qshift NIHSS scale to be performed. CN acknowledged and to assess when available.

## 2021-01-17 NOTE — Progress Notes (Signed)
Informed Blount, NP that patient swallow screen was done this morning and to change NPO orders to a diet.

## 2021-01-17 NOTE — ED Notes (Signed)
Patient transported to MRI 

## 2021-01-17 NOTE — ED Notes (Signed)
Patient back from MRI at this time 

## 2021-01-17 NOTE — ED Provider Notes (Signed)
Emergency Medicine Provider Triage Evaluation Note  Frank Figueroa , a 65 y.o. male  was evaluated in triage.    65 year old male history of CHF hypertension low back pain.  Patient presents to the ER for evaluation of left-sided numbness weakness onset 7 days ago.  Patient was out of town riding passenger in a truck with his family member, forgot his blood pressure medications and fluid pill.  Patient reports 7 days ago he noticed numbness and weakness to his left arm and left leg and this has been constant for the past 7 days and has not changed.  Additionally patient reports that during his travel he noticed increased bilateral lower extremity edema and would like that evaluated as well.  Denies fall/injury, fever/chills, vision change, headache, chest pain/shortness of breath, abdominal pain, nausea/vomiting or any additional concerns.  Review of Systems  Positive: Left arm/leg numbness/weakness.  Bilateral lower extremity edema. Negative: Vision change, headache, fever/chills, chest pain/shortness of breath, abdominal pain or any additional concerns.  Physical Exam  BP (!) 207/109 (BP Location: Left Arm)   Pulse 75   Temp 97.7 F (36.5 C) (Oral)   Resp 18   Ht 5\' 11"  (1.803 m)   Wt 120.2 kg   SpO2 100%   BMI 36.96 kg/m  Gen:   Awake, no distress  Resp:  Normal effort  MSK:   Moves extremities without difficulty Other:  2+ bilateral lower extremity edema.  Speech is clear and goal oriented, follows commands Major Cranial nerves without deficit, no facial droop 3/5 strength movements left upper and lower extremity.  Diminished sensation left upper and lower extremity. Imbalance with Romberg, slight left pronator drift    Medical Decision Making  Medically screening exam initiated at 7:48 AM.  Appropriate orders placed.  Frank Figueroa was informed that the remainder of the evaluation will be completed by another provider, this initial triage assessment does not replace that  evaluation, and the importance of remaining in the ED until their evaluation is complete.  Concern patient with CVA onset 1 week ago.  CVA order set initiated.  Discussed case with Dr. Gala Romney agrees with plan of care.  Patient is out of the stroke window.     Note: Portions of this report may have been transcribed using voice recognition software. Every effort was made to ensure accuracy; however, inadvertent computerized transcription errors may still be present.    Frank Figueroa 01/17/21 01/19/21    [id number], MD 01/18/21 [phone]

## 2021-01-17 NOTE — ED Notes (Signed)
Patient resting in bed with eyes closed, awoken to given meds at this time

## 2021-01-17 NOTE — ED Notes (Signed)
Patient transported to CT 

## 2021-01-17 NOTE — ED Notes (Signed)
Patient's niece to bedside at this time

## 2021-01-17 NOTE — H&P (Signed)
History and Physical    Frank Figueroa EAV:[id number] DOB: 12/03/1955 DOA: 01/17/2021  PCP: Albertha GheeSteen, James, MD  Patient coming from: Home  Chief Complaint: hand and leg paresthesias/weakness   HPI: Frank Figueroa is a 65 y.o. male with medical history significant of HTN. Presenting with 1 week of hand and leg paresthesias and weakness. He states that he's had similar symptoms for "a while". However, they seemed to worsen over the last week. He initial felt a numbness in his hand, but he thought it was d/t gripping his steering wheel too tight. This sensation came on and off throughout the week. He also had a sensation of "standing on pins" in his left leg. Again, this sensation came on and off throughout the week. He didn't try any medicines or remedies for his symptoms. In fact, he reports that he hasn't take his regular BP meds in over a week. His family became concerned at recommended that he come to the ED for assistance. He denies any other aggravating or alleviating factors.   ED Course: CTH showed old lacunar infarct. MRI brain was negative for anything acute. EDP spoke with neuro. They recommended MRA and admission for BP control. TRH was called for admission.   Review of Systems:  Denies CP, palpitations, dyspnea, syncopal episodes, lightheadedness, dizziness, N/V/D, fever, HA. Review of systems is otherwise negative for all not mentioned in HPI.   PMHx Past Medical History:  Diagnosis Date  . Heart failure (HCC)   . Hypertension   . Low back pain     PSHx Past Surgical History:  Procedure Laterality Date  . HERNIA REPAIR     When he was approximately 6 years    SocHx  reports that he has quit smoking. His smoking use included cigarettes. He has never used smokeless tobacco. He reports previous alcohol use. He reports previous drug use.  Allergies  Allergen Reactions  . Tape     FamHx Family History  Problem Relation Age of Onset  . Stroke Mother [id number]  . Heart attack  Father   . Cancer Father        886 , unknown type - passed away from this cancer  . Breast cancer Sister [id number]    Prior to Admission medications   Medication Sig Start Date End Date Taking? Authorizing Provider  amLODipine (NORVASC) 5 MG tablet TAKE 1 TABLET(5 MG) BY MOUTH DAILY Patient taking differently: Take 5 mg by mouth daily. 11/04/20  Yes Remo Lippshen, Joshua Y, MD  furosemide (LASIX) 40 MG tablet TAKE 1 TABLET(40 MG) BY MOUTH TWICE DAILY Patient taking differently: Take 40 mg by mouth 2 (two) times daily. 11/04/20  Yes Remo Lippshen, Joshua Y, MD  gabapentin (NEURONTIN) 100 MG capsule Take 1 capsule (100 mg total) by mouth 3 (three) times daily. Patient taking differently: Take 100 mg by mouth 2 (two) times daily. 08/19/20  Yes Seawell, Jaimie A, DO  olmesartan (BENICAR) 40 MG tablet TAKE 1 TABLET(40 MG) BY MOUTH DAILY Patient taking differently: Take 40 mg by mouth daily. 11/04/20  Yes Remo Lippshen, Joshua Y, MD  tetrahydrozoline-zinc (VISINE-AC) 0.05-0.25 % ophthalmic solution Place 2 drops into the left eye 3 (three) times daily as needed. Patient not taking: Reported on 01/17/2021 08/19/20   Versie StarksSeawell, Jaimie A, DO    Physical Exam: Vitals:   01/17/21 1000 01/17/21 1115 01/17/21 1155 01/17/21 1200  BP: (!) 218/138 (!) 215/159 (!) 192/104 (!) 184/130  Pulse: 82 72  62  Resp:  20  18  Temp:  TempSrc:      SpO2: 98% 100%  99%  Weight:      Height:        General: 65 y.o. male resting in bed in NAD Eyes: PERRL, normal sclera ENMT: Nares patent w/o discharge, orophaynx clear, dentition normal, ears w/o discharge/lesions/ulcers Neck: Supple, trachea midline Cardiovascular: RRR, +S1, S2, no m/g/r, equal pulses throughout Respiratory: CTABL, no w/r/r, normal WOB GI: BS+, NDNT, no masses noted, no organomegaly noted MSK: No e/c/c Skin: No rashes, bruises, ulcerations noted Neuro: A&O x 3, intact sensation BLE, BUE, slight weakened left hand grip Psyc: Appropriate interaction and affect,  calm/cooperative  Labs on Admission: I have personally reviewed following labs and imaging studies  CBC: Recent Labs  Lab 01/17/21 0931  WBC 7.2  NEUTROABS 4.3  HGB 14.3  HCT 45.2  MCV 97.0  PLT 138*   Basic Metabolic Panel: Recent Labs  Lab 01/17/21 0931  NA 143  K 4.5  CL 112*  CO2 26  GLUCOSE 99  BUN 24*  CREATININE 2.13*  CALCIUM 8.7*   GFR: Estimated Creatinine Clearance: 46.2 mL/min (A) (by C-G formula based on SCr of 2.13 mg/dL (H)). Liver Function Tests: Recent Labs  Lab 01/17/21 0931  AST 39  ALT 30  ALKPHOS 76  BILITOT 0.6  PROT 7.5  ALBUMIN 3.7   No results for input(s): LIPASE, AMYLASE in the last 168 hours. No results for input(s): AMMONIA in the last 168 hours. Coagulation Profile: Recent Labs  Lab 01/17/21 0931  INR 0.9   Cardiac Enzymes: No results for input(s): CKTOTAL, CKMB, CKMBINDEX, TROPONINI in the last 168 hours. BNP (last 3 results) No results for input(s): PROBNP in the last 8760 hours. HbA1C: No results for input(s): HGBA1C in the last 72 hours. CBG: No results for input(s): GLUCAP in the last 168 hours. Lipid Profile: No results for input(s): CHOL, HDL, LDLCALC, TRIG, CHOLHDL, LDLDIRECT in the last 72 hours. Thyroid Function Tests: No results for input(s): TSH, T4TOTAL, FREET4, T3FREE, THYROIDAB in the last 72 hours. Anemia Panel: No results for input(s): VITAMINB12, FOLATE, FERRITIN, TIBC, IRON, RETICCTPCT in the last 72 hours. Urine analysis:    Component Value Date/Time   COLORURINE YELLOW 01/17/2021 0832   APPEARANCEUR CLEAR 01/17/2021 0832   LABSPEC 1.010 01/17/2021 0832   PHURINE 6.0 01/17/2021 0832   GLUCOSEU 50 (A) 01/17/2021 0832   HGBUR SMALL (A) 01/17/2021 0832   BILIRUBINUR NEGATIVE 01/17/2021 0832   KETONESUR NEGATIVE 01/17/2021 0832   PROTEINUR NEGATIVE 01/17/2021 0832   NITRITE NEGATIVE 01/17/2021 0832   LEUKOCYTESUR NEGATIVE 01/17/2021 0832    Radiological Exams on Admission: CT HEAD WO  CONTRAST  Result Date: 01/17/2021 CLINICAL DATA:  Left-sided weakness EXAM: CT HEAD WITHOUT CONTRAST TECHNIQUE: Contiguous axial images were obtained from the base of the skull through the vertex without intravenous contrast. COMPARISON:  None. FINDINGS: Brain: Old right thalamic lacunar infarct. Chronic small vessel disease throughout the deep white matter. No acute intracranial abnormality. Specifically, no hemorrhage, hydrocephalus, mass lesion, acute infarction, or significant intracranial injury. Vascular: No hyperdense vessel or unexpected calcification. Skull: No acute calvarial abnormality. Sinuses/Orbits: No acute findings Other: None IMPRESSION: Chronic small vessel disease throughout the deep white matter. Old right thalamic lacunar infarct. No acute intracranial abnormality. Electronically Signed   By: Charlett Nose M.D.   On: 01/17/2021 08:22   MR BRAIN WO CONTRAST  Result Date: 01/17/2021 CLINICAL DATA:  Left-sided numbness/tingling for 7 days with weakness. EXAM: MRI HEAD WITHOUT CONTRAST TECHNIQUE: Multiplanar, multiecho  pulse sequences of the brain and surrounding structures were obtained without intravenous contrast. COMPARISON:  Head CT 01/17/2021 FINDINGS: The patient was unable to tolerate the complete examination. Axial and coronal diffusion, sagittal T1, axial FLAIR, axial T2, and axial T2* gradient echo sequences were obtained. The study is motion degraded including severe motion on the FLAIR sequence. Brain: There is no evidence of an acute infarct, mass, midline shift, or extra-axial fluid collection. There are chronic lacunar infarcts with associated chronic blood products in the thalami (right larger than left) and right paramedian pons. Additional chronic microhemorrhages are noted deep in the cerebral hemispheres and brainstem suggesting chronic hypertension. Patchy T2 hyperintensities in the cerebral white matter bilaterally are nonspecific but compatible with moderately age  advanced chronic small vessel ischemic disease. There is mild cerebral atrophy. Vascular: Major intracranial vascular flow voids are preserved. Skull and upper cervical spine: No suspicious marrow lesion. Sinuses/Orbits: Unremarkable orbits. Small mucous retention cyst in the right maxillary sinus. Clear mastoid air cells. Other: None. IMPRESSION: 1. Motion degraded, incomplete examination. 2. No acute infarct. 3. Moderately age advanced chronic small vessel ischemic disease with chronic lacunar infarcts and chronic microhemorrhages as above. Electronically Signed   By: Sebastian Ache M.D.   On: 01/17/2021 11:10    EKG: Independently reviewed. Sinus, RBBB  Assessment/Plan Hypertensive urgency HTN     - admit to inpt, progressive     - BP goal 140/80, bring down slowly (spoke with neuro)     - has been non-compliant on medications for at least the past week     - baseline Scr seems to be around 1.8 - 2.0; ok to continue ARB (he was given 37.5 mg irebesartan in ED; his home conversion is 300mg . Will add another 150mg  and titrate as not to bring his BP down too fast)  LUE/LLE parethesias Possible TIA     - CTH shows old infarcts; MRI does not show anything acute     - spoke with neuro (Dr. ); MRA ordered to check for stenosis; does not believe this is acute     - complete TIA w/u     - ok to bring BP to 140/80     - of note, he is followed at the [facility]; he has had these parethesias for some time; they have tried getting him in for imaging in the past; after TIA w/u, he can continue outpt follow up with them     - echo, A1c, lipids  Elevated BNP     - as part of TIA w/u, he will get an echo     - he is already on lasix and ARB; follow  CKD3b     - baseline Scr is between 1.8 and 2.0; he's at 2.1 today. Ok to continue lasix, ARB; trend Scr.  Medical non-compliance      - counseled on importance of maintaining compliance on medical regimen  DVT prophylaxis: SCDs  Code  Status: FULL  Family Communication: None at bedside  Consults called: EDP w/ Neurology  Status is: Inpatient  Remains inpatient appropriate because:Inpatient level of care appropriate due to severity of illness   Dispo: The patient is from: Home              Anticipated d/c is to: Home              Patient currently is not medically stable to d/c.   Difficult to place patient No  DO [facility]  If 7PM-7AM, please contact night-coverage [email or web address]  01/17/2021, 12:17 PM

## 2021-01-17 NOTE — ED Notes (Signed)
Patient back from CT at this time

## 2021-01-17 NOTE — ED Triage Notes (Signed)
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 Patient has swelling to his ankles. Patient states that he did not take his BP meds while on the road. BP in triage-207/109. Patient states that he has "Pins and needles " feeling on the left side of his body x 3 weeks. Patient states he normally takes gabapentin which is not working at this time.

## 2021-01-17 NOTE — ED Provider Notes (Signed)
[facility]-EMERGENCY DEPT Provider Note   CSN: [id number] Arrival date & time: 01/17/21  0700     History Chief Complaint  Patient presents with  . Hypertension  . Joint Swelling    Frank Figueroa is a 65 y.o. male.  65 year old male with prior medical history as detailed below presents for evaluation.  Patient reports that he has not taken his medications for the last week.  Patient reports approximately 1 week of left arm and left leg numbness and tingling.  Patient reports feeling weak in these extremities as well.  Patient reports his symptoms of been ongoing for the whole week.  He reports that he has not taken his antihypertensives or other medications for this time period as well.  He denies visual change.  He denies speech change.  The history is provided by the patient and medical records.  Illness Location:  Left arm and left leg tingling and numbness Severity:  Moderate Onset quality:  Gradual Duration:  7 days Timing:  Constant Progression:  Unchanged Chronicity:  New      Past Medical History:  Diagnosis Date  . Heart failure (HCC)   . Hypertension   . Low back pain     Patient Active Problem List   Diagnosis Date Noted  . Hypertensive urgency 01/17/2021  . Lumbar radiculopathy 08/19/2020  . Cervical radiculopathy 08/19/2020  . Eye injury 08/16/2020  . Heart failure (HCC) 07/09/2020  . Hypertension 07/09/2020  . Back pain 07/09/2020    Past Surgical History:  Procedure Laterality Date  . HERNIA REPAIR     When he was approximately 6 years       Family History  Problem Relation Age of Onset  . Stroke Mother 282  . Heart attack Father   . Cancer Father        1086 , unknown type - passed away from this cancer  . Breast cancer Sister 1750    Social History   Tobacco Use  . Smoking status: Former Smoker    Types: Cigarettes  . Smokeless tobacco: Never Used  . Tobacco comment: Smoked all together 15 years. Stopped and  started throughout his life. When he smoked he smoked 1 pack every 3 days.    Vaping Use  . Vaping Use: Never used  Substance Use Topics  . Alcohol use: Not Currently    Comment: Drank a bottle of liquor every other day before quitting  . Drug use: Not Currently    Home Medications Prior to Admission medications   Medication Sig Start Date End Date Taking? Authorizing Provider  amLODipine (NORVASC) 5 MG tablet TAKE 1 TABLET(5 MG) BY MOUTH DAILY Patient taking differently: Take 5 mg by mouth daily. 11/04/20  Yes Remo Lippshen, Joshua Y, MD  furosemide (LASIX) 40 MG tablet TAKE 1 TABLET(40 MG) BY MOUTH TWICE DAILY Patient taking differently: Take 40 mg by mouth 2 (two) times daily. 11/04/20  Yes Remo Lippshen, Joshua Y, MD  gabapentin (NEURONTIN) 100 MG capsule Take 1 capsule (100 mg total) by mouth 3 (three) times daily. Patient taking differently: Take 100 mg by mouth 2 (two) times daily. 08/19/20  Yes Seawell, Jaimie A, DO  olmesartan (BENICAR) 40 MG tablet TAKE 1 TABLET(40 MG) BY MOUTH DAILY Patient taking differently: Take 40 mg by mouth daily. 11/04/20  Yes Remo Lippshen, Joshua Y, MD  tetrahydrozoline-zinc (VISINE-AC) 0.05-0.25 % ophthalmic solution Place 2 drops into the left eye 3 (three) times daily as needed. Patient not taking: Reported on 01/17/2021 08/19/20  Seawell, Jaimie A, DO    Allergies    Tape  Review of Systems   Review of Systems  All other systems reviewed and are negative.   Physical Exam Updated Vital Signs BP (!) 184/130   Pulse 62   Temp 97.7 F (36.5 C) (Oral)   Resp 18   Ht 5\' 11"  (1.803 m)   Wt 120.2 kg   SpO2 99%   BMI 36.96 kg/m   Physical Exam Vitals and nursing note reviewed.  Constitutional:      General: He is not in acute distress.    Appearance: Normal appearance. He is well-developed.  HENT:     Head: Normocephalic and atraumatic.  Eyes:     Conjunctiva/sclera: Conjunctivae normal.     Pupils: Pupils are equal, round, and reactive to light.   Cardiovascular:     Rate and Rhythm: Normal rate and regular rhythm.     Heart sounds: Normal heart sounds.  Pulmonary:     Effort: Pulmonary effort is normal. No respiratory distress.     Breath sounds: Normal breath sounds.  Abdominal:     General: There is no distension.     Palpations: Abdomen is soft.     Tenderness: There is no abdominal tenderness.  Musculoskeletal:        General: No deformity. Normal range of motion.     Cervical back: Normal range of motion and neck supple.  Skin:    General: Skin is warm and dry.  Neurological:     General: No focal deficit present.     Mental Status: He is alert and oriented to person, place, and time.     Comments: Normal speech AO x4   No facial droop  5/5 strength to BUE and BLE      ED Results / Procedures / Treatments   Labs (all labs ordered are listed, but only abnormal results are displayed) Labs Reviewed  CBC - Abnormal; Notable for the following components:      Result Value   Platelets 138 (*)    All other components within normal limits  COMPREHENSIVE METABOLIC PANEL - Abnormal; Notable for the following components:   Chloride 112 (*)    BUN 24 (*)    Creatinine, Ser 2.13 (*)    Calcium 8.7 (*)    GFR, Estimated 34 (*)    All other components within normal limits  URINALYSIS, ROUTINE W REFLEX MICROSCOPIC - Abnormal; Notable for the following components:   Glucose, UA 50 (*)    Hgb urine dipstick SMALL (*)    All other components within normal limits  BRAIN NATRIURETIC PEPTIDE - Abnormal; Notable for the following components:   B Natriuretic Peptide 165.3 (*)    All other components within normal limits  RESP PANEL BY RT-PCR (FLU A&B, COVID) ARPGX2  PROTIME-INR  APTT  DIFFERENTIAL  RAPID URINE DRUG SCREEN, HOSP PERFORMED  ETHANOL    EKG EKG Interpretation  Date/Time:  Friday Jan 17 2021 08:19:37 EDT Ventricular Rate:  65 PR Interval:  135 QRS Duration: 157 QT Interval:  463 QTC  Calculation: 482 R Axis:   -58 Text Interpretation: Sinus rhythm RBBB and LAFB LVH by voltage Confirmed by [birth year]1981 [phone]) on 01/17/2021 9:30:48 AM   Radiology CT HEAD WO CONTRAST  Result Date: 01/17/2021 CLINICAL DATA:  Left-sided weakness EXAM: CT HEAD WITHOUT CONTRAST TECHNIQUE: Contiguous axial images were obtained from the base of the skull through the vertex without intravenous contrast. COMPARISON:  None. 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 No acute intracranial abnormality. Specifically, no hemorrhage, hydrocephalus, mass lesion, acute infarction, or significant intracranial injury. Vascular: No hyperdense vessel or unexpected calcification. Skull: No acute calvarial abnormality. Sinuses/Orbits: No acute findings Other: None IMPRESSION: Chronic small vessel disease throughout the deep white matter. Old right thalamic lacunar infarct. No acute intracranial abnormality. Electronically Signed   By: Charlett Nose M.D.   On: 01/17/2021 08:22   MR BRAIN WO CONTRAST  Result Date: 01/17/2021 CLINICAL DATA:  Left-sided numbness/tingling for 7 days with weakness. EXAM: MRI HEAD WITHOUT CONTRAST TECHNIQUE: Multiplanar, multiecho pulse sequences of the brain and surrounding structures were obtained without intravenous contrast. COMPARISON:  Head CT 01/17/2021 FINDINGS: The patient was unable to tolerate the complete examination. Axial and coronal diffusion, sagittal T1, axial FLAIR, axial T2, and axial T2* gradient echo sequences were obtained. The study is motion degraded including severe motion on the FLAIR sequence. Brain: There is no evidence of an acute infarct, mass, midline shift, or extra-axial fluid collection. There are chronic lacunar infarcts with associated chronic blood products in the thalami (right larger than left) and right paramedian pons. Additional chronic microhemorrhages are noted deep in the cerebral  hemispheres and brainstem suggesting chronic hypertension. Patchy T2 hyperintensities in the cerebral white matter bilaterally are nonspecific but compatible with moderately age advanced chronic small vessel ischemic disease. There is mild cerebral atrophy. Vascular: Major intracranial vascular flow voids are preserved. Skull and upper cervical spine: No suspicious marrow lesion. Sinuses/Orbits: Unremarkable orbits. Small mucous retention cyst in the right maxillary sinus. Clear mastoid air cells. Other: None. IMPRESSION: 1. Motion degraded, incomplete examination. 2. No acute infarct. 3. Moderately age advanced chronic small vessel ischemic disease with chronic lacunar infarcts and chronic microhemorrhages as above. Electronically Signed   By: Sebastian Ache M.D.   On: 01/17/2021 11:10    Procedures Procedures   Medications Ordered in ED Medications  aspirin EC tablet 81 mg (81 mg Oral Given 01/17/21 1155)  irbesartan (AVAPRO) tablet 37.5 mg (37.5 mg Oral Given 01/17/21 1213)  amLODipine (NORVASC) tablet 5 mg (5 mg Oral Given 01/17/21 1155)    ED Course  I have reviewed the triage vital signs and the nursing notes.  Pertinent labs & imaging results that were available during my care of the patient were reviewed by me and considered in my medical decision making (see chart for details).    MDM Rules/Calculators/A&P                          MDM  MSE complete  Emmanuel Gruenhagen was evaluated in Emergency Department on 01/17/2021 for the symptoms described in the history of present illness. He was evaluated in the context of the global COVID-19 pandemic, which necessitated consideration that the patient might be at risk for infection with the SARS-CoV-2 virus that causes COVID-19. Institutional protocols and algorithms that pertain to the evaluation of patients at risk for COVID-19 are in a state of rapid change based on information released by regulatory bodies including the CDC and federal and state  organizations. These policies and algorithms were followed during the patient's care in the ED.   Patient is presenting for evaluation of reported paresthesias to the left arm and left leg in the setting of uncontrolled hypertension.  Patient without acute CVA found on CT or MRI.  Patient outside window for tPA given his 7 days of symptoms.  Case discussed with neuro Dr. Selina Cooley who agrees with plan for admission for control of hypertension.  Hospitalist services is aware of case and will evaluate for admission.  Final Clinical Impression(s) / ED Diagnoses Final diagnoses:  Hypertensive urgency    Rx / DC Orders ED Discharge Orders    None       Wynetta Fines, MD 01/17/21 1227

## 2021-01-18 ENCOUNTER — Inpatient Hospital Stay (HOSPITAL_COMMUNITY): Payer: Medicaid Other

## 2021-01-18 DIAGNOSIS — G459 Transient cerebral ischemic attack, unspecified: Secondary | ICD-10-CM

## 2021-01-18 LAB — ECHOCARDIOGRAM COMPLETE
Area-P 1/2: 3.17 cm2
Height: 71 in
P 1/2 time: 688 msec
S' Lateral: 3.4 cm
Weight: 4240 oz

## 2021-01-18 LAB — LIPID PANEL
Cholesterol: 122 mg/dL (ref 0–200)
HDL: 36 mg/dL — ABNORMAL LOW (ref >40)
LDL Cholesterol: 75 mg/dL (ref 0–99)
Total CHOL/HDL Ratio: 3.4 RATIO
Triglycerides: 57 mg/dL (ref <150)
VLDL: 11 mg/dL (ref 0–40)

## 2021-01-18 LAB — HIV ANTIBODY (ROUTINE TESTING W REFLEX): HIV Screen 4th Generation wRfx: NONREACTIVE

## 2021-01-18 LAB — HEMOGLOBIN A1C
Hgb A1c MFr Bld: 5.6 % (ref 4.8–5.6)
Mean Plasma Glucose: 114.02 mg/dL

## 2021-01-18 NOTE — Progress Notes (Signed)
SLP Cancellation Note  Patient Details Name: Frank Figueroa MRN: [id number] DOB: 1956/02/20   Cancelled treatment:       Reason Eval/Treat Not Completed: SLP screened, no needs identified, will sign off  Shelene Krage L. Samson Frederic, MA CCC/SLP Acute Rehabilitation Services Office number [phone] Pager [phone]  Blenda Mounts Laurice 01/18/2021, 3:50 PM

## 2021-01-18 NOTE — Progress Notes (Signed)
PROGRESS NOTE    Frank RomneyCedric Figueroa  WUJ:[id number] DOB: 07/07/1956 DOA: 01/17/2021 PCP: Albertha GheeSteen, James, MD  Brief Narrative:   This 65 years old male with PMH significant for HTN presents in the ED with 1 week history of left-sided hand and leg numbness.  Patient reports having these symptoms for a while however this seemed to worsen over the last week.  He initially felt the numbness in the left hand and thought it was due to the gripping on his steering wheel too tight.  This sensation came on and off throughout the week then he developed sensation of standing on the pins in his left leg that persisted throughout the week.  He has not tried any remedies or medications.  He also reported not taking his blood pressure medications for one week. 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  MRI brain was negative for acute intracranial abnormality.  ED physician has spoken with neurology, recommended MRA head and neck.  Assessment & Plan:   Active Problems:   Hypertensive urgency  Hypertensive urgency: Patient presented with BP 218/138 on arrival. Patient reports has not taken medication for last 1 week. Blood pressure goal 140/80, bring down slowly as per neurology. Continue irbesartan, amlodipine .  CT head no acute abnormality. Blood pressure is slowly improving.  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 ED physician had spoken with neurology Dr. Egbert GaribaldiStake,  recommended MRA to check for stenosis. Recommended complete TIA work-up. Obtain 2D echocardiogram,  hemoglobin A1c, Lipid profile. MRA Head/ Neck: No proximal intracranial vessel occlusion. Possible marked stenosis of the mid right M1 MCA. No occlusion or hemodynamically significant stenosis in the neck.   CKD stage IIIb: Baseline serum creatinine between 1.8-2.0. Serum creatinine at baseline,  continue Lasix and irbesartan.   Medication noncompliance:   Counseled on importance of  taking blood pressure medications.   DVT prophylaxis: SCDs Code Status: Full code. Family Communication:  No family at bedside. Disposition Plan:  Status is: Inpatient  Remains inpatient appropriate because:Inpatient level of care appropriate due to severity of illness   Dispo: The patient is from: Home              Anticipated d/c is to: Home              Patient currently is not medically stable to d/c.   Difficult to place patient NO  Consultants:    None  Procedures: MRI head and neck, CT head. Antimicrobials:  Anti-infectives (From admission, onward)   None      Subjective: Patient was seen and examined at bedside.  He reports feeling better,  He still reports having numbness and tingling in the left arm which remains the same.  Objective: Vitals:   01/18/21 0039 01/18/21 0538 01/18/21 0539 01/18/21 0705  BP: (!) 173/100 (!) 170/110 (!) 182/80 (!) 164/98  Pulse: 68 62 77 79  Resp: (!) 25 (!) 24 (!) 24   Temp: 98 F (36.7 C) 98.8 F (37.1 C) 98.8 F (37.1 C)   TempSrc: Oral Oral Oral   SpO2: 100% 99% 100% 98%  Weight:      Height:        Intake/Output Summary (Last 24 hours) at 01/18/2021 1218 Last data filed at 01/18/2021 1034 Gross per 24 hour  Intake 600 ml  Output 2400 ml  Net -1800 ml   Filed Weights   01/17/21 0728  Weight: 120.2 kg  Examination:  General exam: Appears calm and comfortable, not in any acute distress. Respiratory system: Clear to auscultation. Respiratory effort normal. Cardiovascular system: S1 & S2 heard, RRR. No JVD, murmurs, rubs, gallops or clicks. No pedal edema. Gastrointestinal system: Abdomen is nondistended, soft and nontender. No organomegaly or masses felt.  Normal bowel sounds heard. Central nervous system: Alert and oriented. No focal neurological deficits. Extremities: Symmetric 5 x 5 power.  No edema, no cyanosis no clubbing. Skin: No rashes, lesions or ulcers Psychiatry: Judgement and insight appear  normal. Mood & affect appropriate.     Data Reviewed: I have personally reviewed following labs and imaging studies  CBC: Recent Labs  Lab 01/17/21 0931  WBC 7.2  NEUTROABS 4.3  HGB 14.3  HCT 45.2  MCV 97.0  PLT 138*   Basic Metabolic Panel: Recent Labs  Lab 01/17/21 0931  NA 143  K 4.5  CL 112*  CO2 26  GLUCOSE 99  BUN 24*  CREATININE 2.13*  CALCIUM 8.7*   GFR: Estimated Creatinine Clearance: 46.2 mL/min (A) (by C-G formula based on SCr of 2.13 mg/dL (H)). Liver Function Tests: Recent Labs  Lab 01/17/21 0931  AST 39  ALT 30  ALKPHOS 76  BILITOT 0.6  PROT 7.5  ALBUMIN 3.7   No results for input(s): LIPASE, AMYLASE in the last 168 hours. No results for input(s): AMMONIA in the last 168 hours. Coagulation Profile: Recent Labs  Lab 01/17/21 0931  INR 0.9   Cardiac Enzymes: No results for input(s): CKTOTAL, CKMB, CKMBINDEX, TROPONINI in the last 168 hours. BNP (last 3 results) No results for input(s): PROBNP in the last 8760 hours. HbA1C: Recent Labs    01/18/21 0407  HGBA1C 5.6   CBG: No results for input(s): GLUCAP in the last 168 hours. Lipid Profile: Recent Labs    01/18/21 0407  CHOL 122  HDL 36*  LDLCALC 75  TRIG 57  CHOLHDL 3.4   Thyroid Function Tests: No results for input(s): TSH, T4TOTAL, FREET4, T3FREE, THYROIDAB in the last 72 hours. Anemia Panel: No results for input(s): VITAMINB12, FOLATE, FERRITIN, TIBC, IRON, RETICCTPCT in the last 72 hours. Sepsis Labs: No results for input(s): PROCALCITON, LATICACIDVEN in the last 168 hours.  Recent Results (from the past 240 hour(s))  Resp Panel by RT-PCR (Flu A&B, Covid) Nasopharyngeal Swab     Status: None   Collection Time: 01/17/21  8:29 AM   Specimen: Nasopharyngeal Swab; Nasopharyngeal(NP) swabs in vial transport medium  Result Value Ref Range Status   SARS Coronavirus 2 by RT PCR NEGATIVE NEGATIVE Final    Comment: (NOTE) SARS-CoV-2 target nucleic acids are NOT  DETECTED.  The SARS-CoV-2 RNA is generally detectable in upper respiratory specimens during the acute phase of infection. The lowest concentration of SARS-CoV-2 viral copies this assay can detect is 138 copies/mL. A negative result does not preclude SARS-Cov-2 infection and should not be used as the sole basis for treatment or other patient management decisions. A negative result may occur with  improper specimen collection/handling, submission of specimen other than nasopharyngeal swab, presence of viral mutation(s) within the areas targeted by this assay, and inadequate number of viral copies(<138 copies/mL). A negative result must be combined with clinical observations, patient history, and epidemiological information. The expected result is Negative.  Fact Sheet for Patients:  BloggerCourse.com  Fact Sheet for Healthcare Providers:  SeriousBroker.it  This test is no t yet approved or cleared by the [place] FDA and  has been authorized for detection and/or  diagnosis of SARS-CoV-2 by FDA under an Emergency Use Authorization (EUA). This EUA will remain  in effect (meaning this test can be used) for the duration of the COVID-19 declaration under Section 564(b)(1) of the Act, 21 U.S.C.section 360bbb-3(b)(1), unless the authorization is terminated  or revoked sooner.       Influenza A by PCR NEGATIVE NEGATIVE Final   Influenza B by PCR NEGATIVE NEGATIVE Final    Comment: (NOTE) The Xpert Xpress SARS-CoV-2/FLU/RSV plus assay is intended as an aid in the diagnosis of influenza from Nasopharyngeal swab specimens and should not be used as a sole basis for treatment. Nasal washings and aspirates are unacceptable for Xpert Xpress SARS-CoV-2/FLU/RSV testing.  Fact Sheet for Patients: BloggerCourse.com  Fact Sheet for Healthcare Providers: SeriousBroker.it  This test is not yet  approved or cleared by the [place] FDA and has been authorized for detection and/or diagnosis of SARS-CoV-2 by FDA under an Emergency Use Authorization (EUA). This EUA will remain in effect (meaning this test can be used) for the duration of the COVID-19 declaration under Section 564(b)(1) of the Act, 21 U.S.C. section 360bbb-3(b)(1), unless the authorization is terminated or revoked.  Performed at [facility], [address] [place], [place] [address]     Radiology Studies: CT HEAD WO CONTRAST  Result Date: 01/17/2021 CLINICAL DATA:  Left-sided weakness EXAM: CT HEAD WITHOUT CONTRAST TECHNIQUE: Contiguous axial images were obtained from the base of the skull through the vertex without intravenous contrast. COMPARISON:  None. FINDINGS: Brain: Old right thalamic lacunar infarct. Chronic small vessel disease throughout the deep white matter. No acute intracranial abnormality. Specifically, no hemorrhage, hydrocephalus, mass lesion, acute infarction, or significant intracranial injury. Vascular: No hyperdense vessel or unexpected calcification. Skull: No acute calvarial abnormality. Sinuses/Orbits: No acute findings Other: None IMPRESSION: Chronic small vessel disease throughout the deep white matter. Old right thalamic lacunar infarct. No acute intracranial abnormality. Electronically Signed   By: Charlett Nose M.D.   On: 01/17/2021 08:22   MR ANGIO HEAD WO CONTRAST  Result Date: 01/17/2021 CLINICAL DATA:  Left-sided weakness EXAM: MRA HEAD WITHOUT CONTRAST MRA NECK WITHOUT CONTRAST TECHNIQUE: Angiographic images of the Circle of Willis were obtained using MRA technique without intravenous contrast. Angiographic images of the neck were obtained using MRA technique without intravenous contrast. Carotid stenosis measurements (when applicable) are obtained utilizing NASCET criteria, using the distal internal carotid diameter as the denominator. COMPARISON:  None. FINDINGS: MRA  HEAD FINDINGS Intracranial internal carotid arteries are patent. Middle and anterior cerebral arteries are patent. Possible marked focal right M1 MCA stenosis. Intracranial vertebral arteries, basilar artery, posterior cerebral arteries are patent. Bilateral posterior communicating arteries are present. There is no aneurysm. MRA NECK FINDINGS Common, internal, and external carotid arteries are patent extracranial vertebral arteries are patent. No hemodynamically significant stenosis or evidence of dissection. IMPRESSION: No proximal intracranial vessel occlusion. Possible marked stenosis of the mid right M1 MCA (could be exaggerated by artifact). No occlusion or hemodynamically significant stenosis in the neck. Electronically Signed   By: Guadlupe Spanish M.D.   On: 01/17/2021 14:18   MR ANGIO NECK WO CONTRAST  Result Date: 01/17/2021 CLINICAL DATA:  Left-sided weakness EXAM: MRA HEAD WITHOUT CONTRAST MRA NECK WITHOUT CONTRAST TECHNIQUE: Angiographic images of the Circle of Willis were obtained using MRA technique without intravenous contrast. Angiographic images of the neck were obtained using MRA technique without intravenous contrast. Carotid stenosis measurements (when applicable) are obtained utilizing NASCET criteria, using the distal internal carotid diameter  as the denominator. COMPARISON:  None. FINDINGS: MRA HEAD FINDINGS Intracranial internal carotid arteries are patent. Middle and anterior cerebral arteries are patent. Possible marked focal right M1 MCA stenosis. Intracranial vertebral arteries, basilar artery, posterior cerebral arteries are patent. Bilateral posterior communicating arteries are present. There is no aneurysm. MRA NECK FINDINGS Common, internal, and external carotid arteries are patent extracranial vertebral arteries are patent. No hemodynamically significant stenosis or evidence of dissection. IMPRESSION: No proximal intracranial vessel occlusion. Possible marked stenosis of the mid  right M1 MCA (could be exaggerated by artifact). No occlusion or hemodynamically significant stenosis in the neck. Electronically Signed   By: Guadlupe Spanish M.D.   On: 01/17/2021 14:18   MR BRAIN WO CONTRAST  Result Date: 01/17/2021 CLINICAL DATA:  Left-sided numbness/tingling for 7 days with weakness. EXAM: MRI HEAD WITHOUT CONTRAST TECHNIQUE: Multiplanar, multiecho pulse sequences of the brain and surrounding structures were obtained without intravenous contrast. COMPARISON:  Head CT 01/17/2021 FINDINGS: The patient was unable to tolerate the complete examination. Axial and coronal diffusion, sagittal T1, axial FLAIR, axial T2, and axial T2* gradient echo sequences were obtained. The study is motion degraded including severe motion on the FLAIR sequence. Brain: There is no evidence of an acute infarct, mass, midline shift, or extra-axial fluid collection. There are chronic lacunar infarcts with associated chronic blood products in the thalami (right larger than left) and right paramedian pons. Additional chronic microhemorrhages are noted deep in the cerebral hemispheres and brainstem suggesting chronic hypertension. Patchy T2 hyperintensities in the cerebral white matter bilaterally are nonspecific but compatible with moderately age advanced chronic small vessel ischemic disease. There is mild cerebral atrophy. Vascular: Major intracranial vascular flow voids are preserved. Skull and upper cervical spine: No suspicious marrow lesion. Sinuses/Orbits: Unremarkable orbits. Small mucous retention cyst in the right maxillary sinus. Clear mastoid air cells. Other: None. IMPRESSION: 1. Motion degraded, incomplete examination. 2. No acute infarct. 3. Moderately age advanced chronic small vessel ischemic disease with chronic lacunar infarcts and chronic microhemorrhages as above. Electronically Signed   By: Sebastian Ache M.D.   On: 01/17/2021 11:10   Scheduled Meds: . amLODipine  5 mg Oral Daily  . aspirin EC  81  mg Oral Daily  . furosemide  40 mg Oral BID  . gabapentin  100 mg Oral BID  . irbesartan  300 mg Oral Daily   Continuous Infusions:   LOS: 1 day    Time spent: 35 mins    Harbert Fitterer, MD [facility]   If 7PM-7AM, please contact night-coverage

## 2021-01-18 NOTE — Progress Notes (Signed)
  Echocardiogram 2D Echocardiogram has been performed.  Frank Figueroa 01/18/2021, 10:39 AM

## 2021-01-18 NOTE — Evaluation (Signed)
Occupational Therapy Evaluation and Discharge Patient Details Name: Frank Figueroa MRN: [id number] DOB: 03/05/1956 Today's Date: 01/18/2021    History of Present Illness Frank Figueroa is a 65 y.o. male with medical history significant of HTN. 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 MRI brain was negative for anything acute.   Clinical Impression   This 65 yo male admitted with above presents to acute OT with PLOF being totally independent with all basic ADLs an doing some IADLs. Currently he is at a S-min guard A level when up on his feet due to his left hemiparesis and reports his nephew is there all the time at home so he will have S/A prn. He is functional with LUE but has to use his vision to compensate or else he sometimes drops items (no testing thus far has given indication as to why he has decreased sensation on left side). OP OT is being recommended as well as shower seat for safety. He reports this decreased sensation has gotten worse since he has not had pain meds for about 6 months now. No further acute OT we will sign off.    Follow Up Recommendations  Outpatient OT    Equipment Recommendations  Tub/shower seat       Precautions / Restrictions Precautions Precautions: Fall Restrictions Weight Bearing Restrictions: No      Mobility Bed Mobility Overal bed mobility: Modified Independent             General bed mobility comments: HOB up    Transfers Overall transfer level: Needs assistance Equipment used: None Transfers: Sit to/from Stand Sit to Stand: Supervision         General transfer comment: min guard A ambulating to and from bathroom with pt reporting "I feel drunk" but not LOB or swaying only a wide base of support.    Balance Overall balance assessment: Mild deficits observed, not formally tested         Standing balance support: No upper extremity supported Standing balance-Leahy  Scale: Good                             ADL either performed or assessed with clinical judgement   ADL   Eating/Feeding: Modified independent Eating/Feeding Details (indicate cue type and reason): increased time due to numbness in left hand Grooming: Supervision/safety;Standing   Upper Body Bathing: Supervision/ safety;Standing   Lower Body Bathing: Supervison/ safety;Sit to/from stand   Upper Body Dressing : Supervision/safety;Sitting   Lower Body Dressing: Supervision/safety;Sit to/from stand   Toilet Transfer: Supervision/safety;Ambulation;Regular Toilet;Grab bars   Toileting- Clothing Manipulation and Hygiene: Supervision/safety;Sit to/from stand               Vision Patient Visual Report: No change from baseline              Pertinent Vitals/Pain Pain Assessment: Faces Pain Score: 8  Faces Pain Scale: Hurts little more Pain Location: left hand Pain Descriptors / Indicators: Numbness;Pins and needles Pain Intervention(s): Monitored during session     Hand Dominance Right   Extremity/Trunk Assessment Upper Extremity Assessment Upper Extremity Assessment: LUE deficits/detail LUE Deficits / Details: Reports it feels different compared to RUE, numb, pins/needles, can feel his finger print when he touches thumb to finger opposition (cannot feel these on right hand), drops items. With testing sensation intact for light touch, proprioception intact, finger opposition intact, able to pick up 3  keys individually-palm them-the place back in my hand individually with decreased coordination and timing compared to RUE (he is not left hand dominant), alternating finger to nose intact, can doff and don socks sitting EOB.     Cervical / Trunk Assessment Cervical / Trunk Assessment: Normal   Communication Communication Communication: No difficulties   Cognition Arousal/Alertness: Awake/alert Behavior During Therapy: WFL for tasks assessed/performed Overall  Cognitive Status: Within Functional Limits for tasks assessed                                                Home Living Family/patient expects to be discharged to:: Private residence Living Arrangements: Other relatives (nephew)   Type of Home: House Home Access: Stairs to enter Secretary/administrator of Steps: 3 Entrance Stairs-Rails: Right;Left       Bathroom Shower/Tub: Tub/shower unit         Home Equipment: Cane - single point          Prior Functioning/Environment Level of Independence: Independent with assistive device(s)        Comments: started using SPC with LE symptoms        OT Problem List: Impaired balance (sitting and/or standing);Impaired sensation      OT Treatment/Interventions:      OT Goals(Current goals can be found in the care plan section) Acute Rehab OT Goals Patient Stated Goal: to figure out what's wrong and go home  OT Frequency:                AM-PAC OT "6 Clicks" Daily Activity     Outcome Measure Help from another person eating meals?: None Help from another person taking care of personal grooming?: A Little Help from another person toileting, which includes using toliet, bedpan, or urinal?: A Little Help from another person bathing (including washing, rinsing, drying)?: A Little Help from another person to put on and taking off regular upper body clothing?: A Little Help from another person to put on and taking off regular lower body clothing?: A Little 6 Click Score: 19   End of Session Nurse Communication:  (pt's condom cath got twisting and back leaked)  Activity Tolerance: Patient tolerated treatment well Patient left: in bed;with call bell/phone within reach;with bed alarm set  OT Visit Diagnosis: Other abnormalities of gait and mobility (R26.89);Other (comment) (left side hemiparesis)                Time: 1125-1140 OT Time Calculation (min): 15 min Charges:  OT General Charges $OT Visit: 1  Visit OT Evaluation $OT Eval Moderate Complexity: 1 Mod  Ignacia Palma, OTR/L Acute [employer] Pager [phone] Office [phone]     Evette Georges 01/18/2021, 12:06 PM

## 2021-01-18 NOTE — Plan of Care (Signed)
  Problem: Coping: Goal: Level of anxiety will decrease Outcome: Progressing   Problem: Activity: Goal: Risk for activity intolerance will decrease Outcome: Progressing   

## 2021-01-18 NOTE — Evaluation (Signed)
Physical Therapy Evaluation Patient Details Name: Frank Figueroa MRN: [id number] DOB: [birth year] Today's Date: 01/18/2021   History of Present Illness  Frank Figueroa is a 65 y.o. male with medical history significant of HTN. 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 MRI brain was negative for anything acute.  Clinical Impression  Pt admitted with above diagnosis.  Pt currently with functional limitations due to the deficits listed below (see PT Problem List). Pt will benefit from skilled PT to increase their independence and safety with mobility to allow discharge to the venue listed below.  Pt very pleasant and agreeable to mobilize.  Pt reports numbness and tingling in Left extremities however reports feels worse in hand and foot.  Pt reports feeling like walking on a pole that might break in regards to left LE.  Pt states this has been occurring for a few weeks and he has been using a cane.  Pt also states he feels it is due to no longer taking pain meds which he had previously been prescribed for approximately 6 months.  Encouraged pt to f/u with OPPT.     Follow Up Recommendations Outpatient PT    Equipment Recommendations  Cane    Recommendations for Other Services       Precautions / Restrictions Precautions Precautions: Fall Restrictions Weight Bearing Restrictions: No      Mobility  Bed Mobility Overal bed mobility: Modified Independent                  Transfers Overall transfer level: Needs assistance Equipment used: None Transfers: Sit to/from Stand Sit to Stand: Min guard         General transfer comment: min/guard for safety, observed wide BOS  Ambulation/Gait Ambulation/Gait assistance: Min guard Gait Distance (Feet): 160 Feet Assistive device: None Gait Pattern/deviations: Step-through pattern;Decreased stride length;Wide base of support     General Gait Details: increased trunk sway  and pt reports feeling "drunk" however overt LOB or physical assist required, pt feels L LE will buckle and has been using SPC in correct hand prior to admission; educated pt to continue with using cane for safety  Stairs            Wheelchair Mobility    Modified Rankin (Stroke Patients Only)       Balance Overall balance assessment: Needs assistance         Standing balance support: No upper extremity supported Standing balance-Leahy Scale: Good                               Pertinent Vitals/Pain Pain Assessment: 0-10 Pain Score: 8  Pain Location: hip and distal Pain Descriptors / Indicators: Numbness;Sharp Pain Intervention(s): Repositioned;Monitored during session    Home Living Family/patient expects to be discharged to:: Private residence Living Arrangements: Other relatives (nephew)   Type of Home: House Home Access: Stairs to enter Entrance Stairs-Rails: Doctor, general practice of Steps: 3   Home Equipment: Cane - single point      Prior Function Level of Independence: Independent with assistive device(s)         Comments: started using SPC with LE symptoms     Hand Dominance        Extremity/Trunk Assessment        Lower Extremity Assessment Lower Extremity Assessment: LLE deficits/detail LLE Deficits / Details: L LE mildly weaker then R LE, grossly 4+/5,  pt reports numbness and tingling which appears worse in foot but still present from hip and below per pt    Cervical / Trunk Assessment Cervical / Trunk Assessment: Normal  Communication   Communication: No difficulties  Cognition Arousal/Alertness: Awake/alert Behavior During Therapy: WFL for tasks assessed/performed Overall Cognitive Status: Within Functional Limits for tasks assessed                                        General Comments      Exercises     Assessment/Plan    PT Assessment Patient needs continued PT services  PT  Problem List Decreased strength;Decreased balance;Decreased knowledge of use of DME;Decreased mobility;Impaired sensation       PT Treatment Interventions Gait training;DME instruction;Balance training;Therapeutic exercise;Functional mobility training;Patient/family education;Therapeutic activities;Neuromuscular re-education    PT Goals (Current goals can be found in the Care Plan section)  Acute Rehab PT Goals PT Goal Formulation: With patient Time For Goal Achievement: 02/01/21 Potential to Achieve Goals: Good    Frequency Min 3X/week   Barriers to discharge        Co-evaluation               AM-PAC PT "6 Clicks" Mobility  Outcome Measure Help needed turning from your back to your side while in a flat bed without using bedrails?: A Little Help needed moving from lying on your back to sitting on the side of a flat bed without using bedrails?: A Little Help needed moving to and from a bed to a chair (including a wheelchair)?: A Little Help needed standing up from a chair using your arms (e.g., wheelchair or bedside chair)?: A Little Help needed to walk in hospital room?: A Little Help needed climbing 3-5 steps with a railing? : A Little 6 Click Score: 18    End of Session Equipment Utilized During Treatment: Gait belt Activity Tolerance: Patient tolerated treatment well Patient left: in bed;with call bell/phone within reach Nurse Communication: Mobility status PT Visit Diagnosis: Difficulty in walking, not elsewhere classified (R26.2)    Time: 1040-1059 PT Time Calculation (min) (ACUTE ONLY): 19 min   Charges:   PT Evaluation $PT Eval Low Complexity: 1 Low         Kati PT, DPT Acute Rehabilitation Services Pager: [phone] Office: [phone]  Maida Sale E 01/18/2021, 11:43 AM

## 2021-01-19 ENCOUNTER — Inpatient Hospital Stay (HOSPITAL_COMMUNITY): Payer: Medicaid Other

## 2021-01-19 DIAGNOSIS — R200 Anesthesia of skin: Secondary | ICD-10-CM

## 2021-01-19 LAB — BASIC METABOLIC PANEL
Anion gap: 6 (ref 5–15)
BUN: 32 mg/dL — ABNORMAL HIGH (ref 8–23)
CO2: 24 mmol/L (ref 22–32)
Calcium: 8.9 mg/dL (ref 8.9–10.3)
Chloride: 109 mmol/L (ref 98–111)
Creatinine, Ser: 2.04 mg/dL — ABNORMAL HIGH (ref 0.61–1.24)
GFR, Estimated: 36 mL/min — ABNORMAL LOW (ref >60)
Glucose, Bld: 100 mg/dL — ABNORMAL HIGH (ref 70–99)
Potassium: 3.7 mmol/L (ref 3.5–5.1)
Sodium: 139 mmol/L (ref 135–145)

## 2021-01-19 LAB — CBC
HCT: 44.6 % (ref 39.0–52.0)
Hemoglobin: 14.7 g/dL (ref 13.0–17.0)
MCH: 31 pg (ref 26.0–34.0)
MCHC: 33 g/dL (ref 30.0–36.0)
MCV: 94.1 fL (ref 80.0–100.0)
Platelets: 142 10*3/uL — ABNORMAL LOW (ref 150–400)
RBC: 4.74 MIL/uL (ref 4.22–5.81)
RDW: 13.7 % (ref 11.5–15.5)
WBC: 6.7 10*3/uL (ref 4.0–10.5)
nRBC: 0 % (ref 0.0–0.2)

## 2021-01-19 LAB — VITAMIN B12: Vitamin B-12: 570 pg/mL (ref 180–914)

## 2021-01-19 LAB — TSH: TSH: 0.748 u[IU]/mL (ref 0.350–4.500)

## 2021-01-19 LAB — PHOSPHORUS: Phosphorus: 4.1 mg/dL (ref 2.5–4.6)

## 2021-01-19 LAB — MAGNESIUM: Magnesium: 2.2 mg/dL (ref 1.7–2.4)

## 2021-01-19 IMAGING — MR MR CERVICAL SPINE W/O CM
4 of 6 series · 29 of 48 positions shown · non-contrast
Comparison: MRA neck [DATE].

CLINICAL DATA: Numbness or tingling, paresthesia. Numbness and
tingling to left upper extremity.

EXAM:
MRI CERVICAL SPINE WITHOUT CONTRAST
TECHNIQUE: Multiplanar, multisequence MR imaging of the cervical spine was
performed. No intravenous contrast was administered.

[Series 6: T1 · sagittal · 3.0mm · 0.69mm/px · 6 of 17 slices shown (1 of 2)]
[im 1/17]
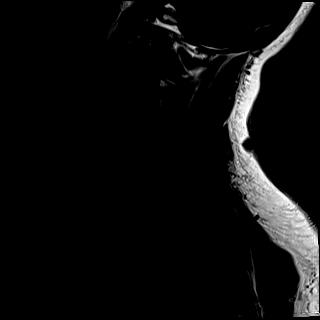
[im 4/17]
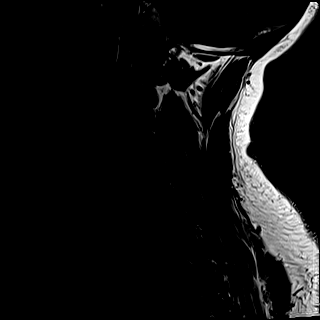
[im 7/17]
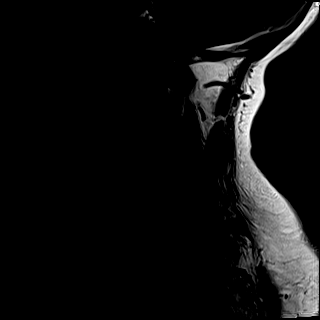
[im 10/17]
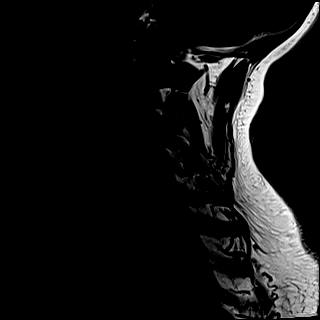
[im 13/17]
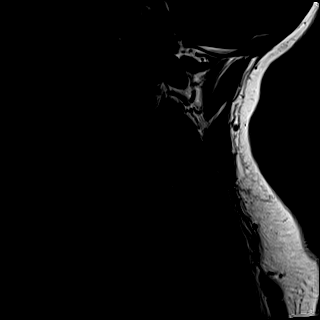
[im 17/17]
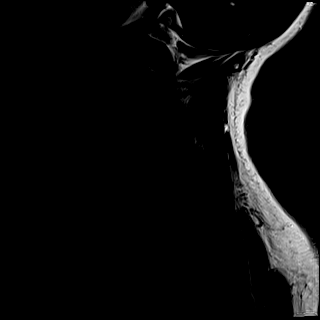

[Series 7: T2 · sagittal · 3.0mm · 0.69mm/px · 5 of 17 slices shown (1 of 2)]
[im 1/17]
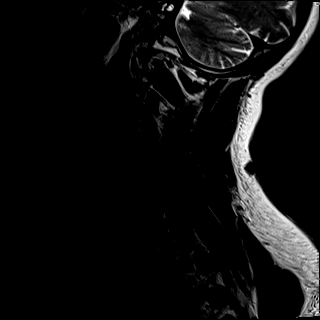
[im 5/17]
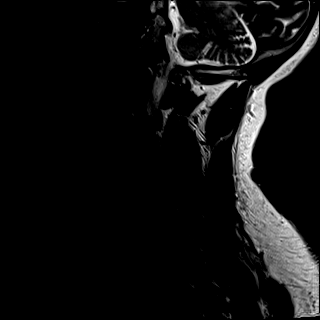
[im 9/17]
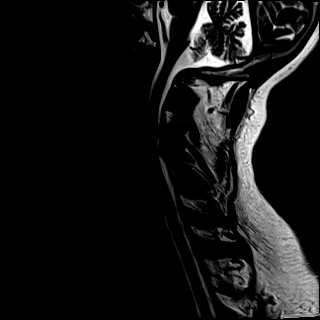
[im 13/17]
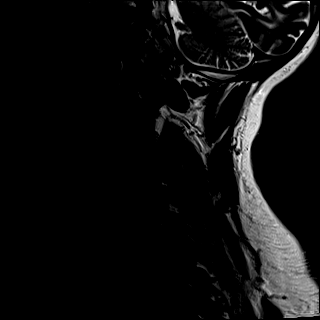
[im 17/17]
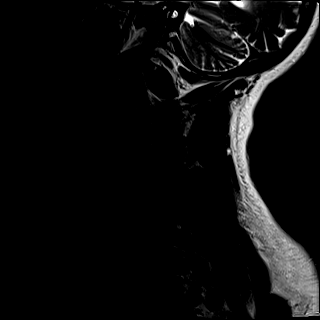

[Series 9: T2 · axial · 3.0mm · 0.78mm/px · z∈[-88,+27]mm · 8 of 33 slices shown (2 of 2)]
[im 1/33]
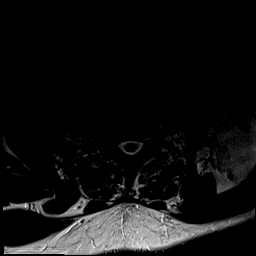
[im 4/33]
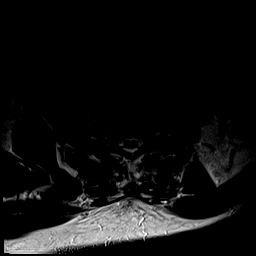
[im 11/33]
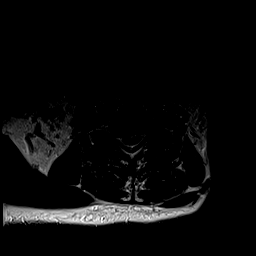
[im 15/33]
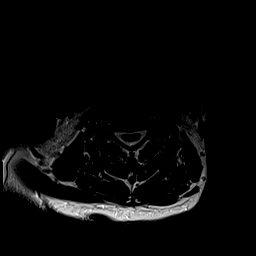
[im 18/33]
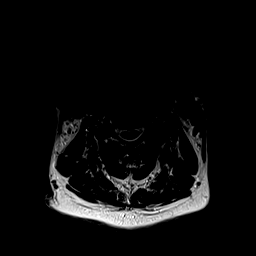
[im 22/33]
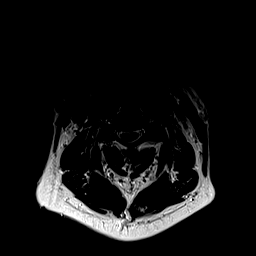
[im 29/33]
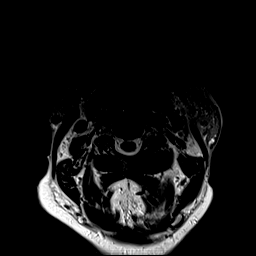
[im 33/33]
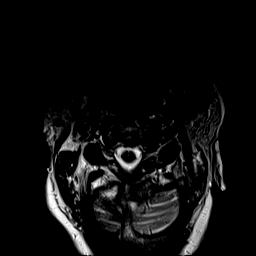

[Series 11: T1 · axial · 3.0mm · 0.39mm/px · z∈[-88,+14]mm · 10 of 35 slices shown (2 of 2)]
[im 1/35]
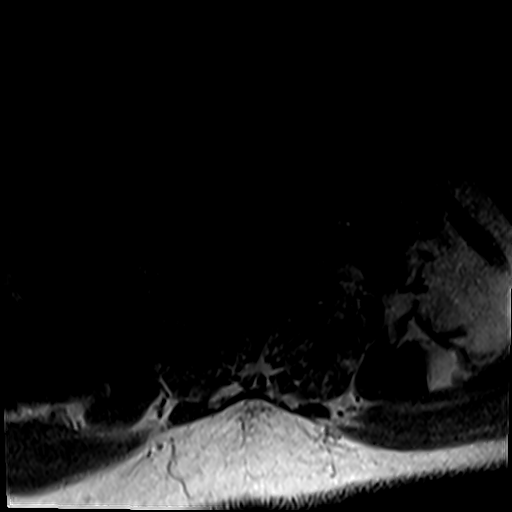
[im 4/35]
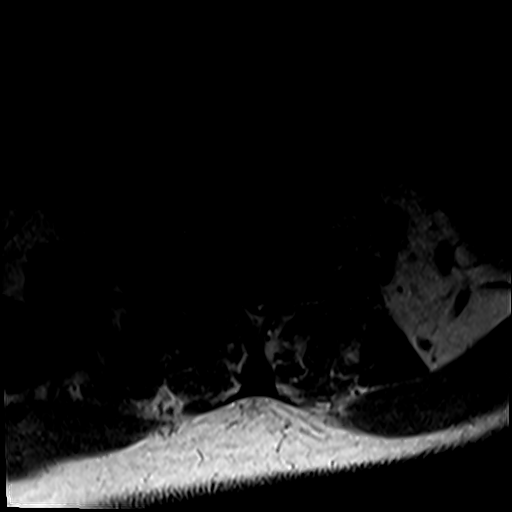
[im 7/35]
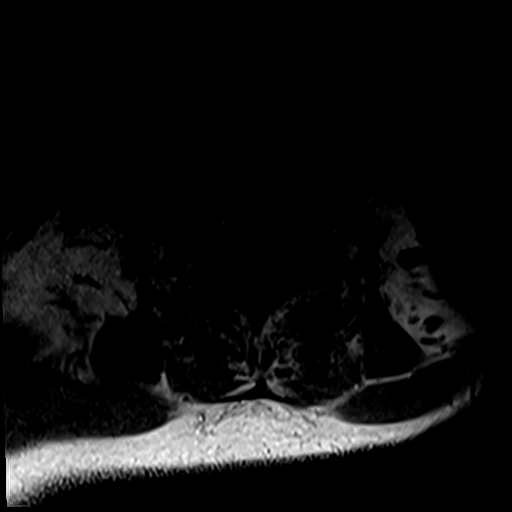
[im 11/35]
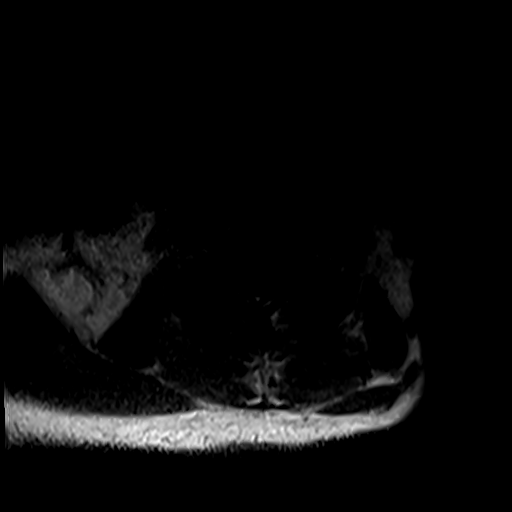
[im 14/35]
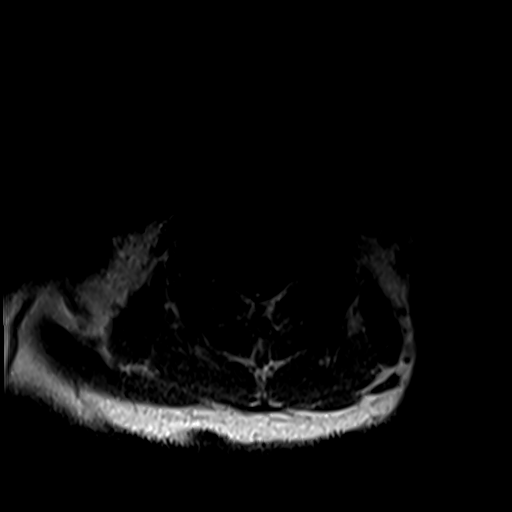
[im 18/35]
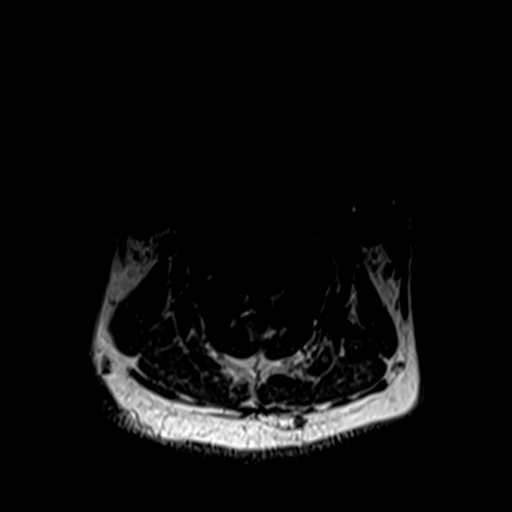
[im 21/35]
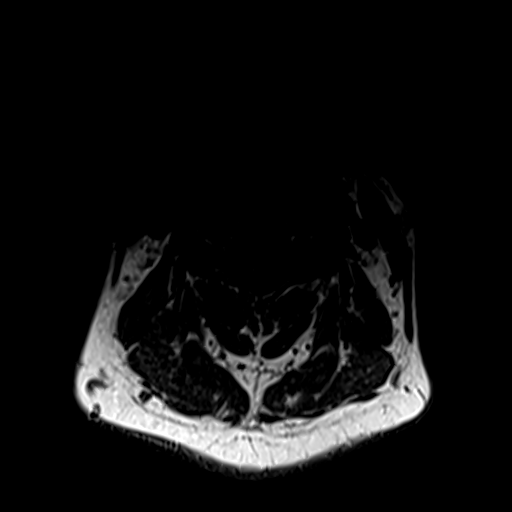
[im 24/35]
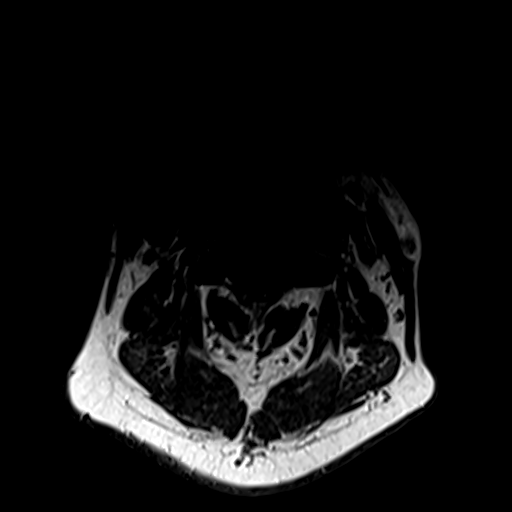
[im 28/35]
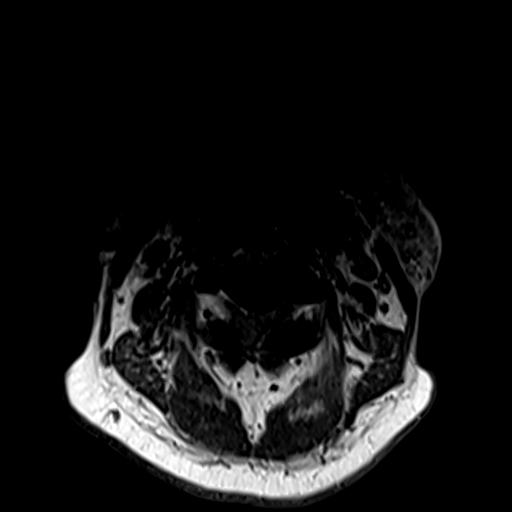
[im 31/35]
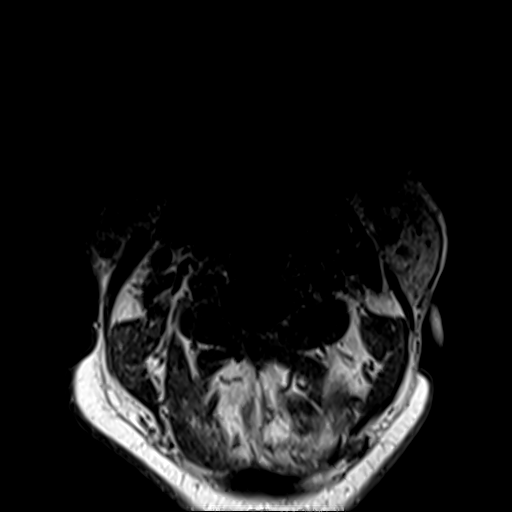

[29 of 48 positions shown; findings below may reference images not displayed]

FINDINGS: Mild intermittent motion degradation.

Alignment: Straightening of the expected cervical lordosis. 2 mm
C3-C4 grade 1 retrolisthesis

Vertebrae: Cervical vertebral body height is maintained. Mild
chronic T1 and T2 superior endplate compression deformities. Mild
multilevel degenerative endplate irregularity greatest at C6-C7.
Mild degenerative endplate edema present at C5-C6 and C6-C7.

Cord: No definite spinal cord signal abnormality is identified.
However, evaluation is limited by motion degradation.

Posterior Fossa, vertebral arteries, paraspinal tissues: No
abnormality identified within included portions of the posterior
fossa. Flow voids preserved within the imaged cervical vertebral
arteries. Paraspinal soft tissues within normal limits.

Disc levels:

Multilevel disc degeneration. Most notably, there is moderate disc
degeneration at C3-C4 and moderate to moderately advanced disc
degeneration at C6-C7.

C2-C3: No significant disc herniation or stenosis.

C3-C4: Disc bulge with endplate spurring and bilateral disc
osteophyte ridge/uncinate hypertrophy. Superimposed small central
disc protrusion. Severe spinal canal stenosis with significant
spinal cord flattening. Severe bilateral neural foraminal narrowing.

C4-C5: Shallow disc bulge. Superimposed small central disc
protrusion. Endplate spurring with bilateral disc osteophyte
ridge/uncinate hypertrophy. Facet arthrosis. Moderate/severe spinal
canal stenosis with mild spinal cord flattening. Bilateral neural
foraminal narrowing (mild right, moderate/severe left).

C5-C6: Shallow disc bulge. Right-sided disc osteophyte
ridge/uncinate hypertrophy. Minimal facet arthrosis. No significant
spinal canal stenosis. Moderate/severe right neural foraminal
narrowing.

C6-C7: Disc bulge with right greater than left disc osteophyte
ridge/uncinate hypertrophy. Mild/moderate spinal canal narrowing.
The disc bulge contacts and mildly flattens the ventral spinal cord.
Bilateral neural foraminal narrowing (severe right, moderate/severe
left).

C7-T1: No significant disc herniation or spinal canal stenosis. Mild
facet arthrosis. Mild relative right neural foraminal narrowing.
IMPRESSION: Cervical spondylosis, as outlined and with findings most notably as
follows.

At C3-C4, there is moderate disc degeneration. Multifactorial severe
spinal canal stenosis with significant spinal cord flattening. No
definite spinal cord signal abnormality is identified, although mild
motion degradation limits evaluation. Severe bilateral neural
foraminal narrowing.

At C4-C5, there is multifactorial moderate/severe spinal canal
stenosis with mild spinal cord flattening. Bilateral neural
foraminal narrowing (mild right, moderate/severe left).

At C6-C7, there is moderate to moderately advanced disc
degeneration. Multifactorial mild/moderate spinal canal stenosis. A
disc bulge contacts and mildly flattens the ventral spinal cord.
Bilateral neural foraminal narrowing (severe right, moderate/severe
left). Mild degenerative endplate edema also present at this level.

Mild chronic T1 and T2 vertebral superior endplate compression
deformities.

## 2021-01-19 MED ORDER — GABAPENTIN 300 MG PO CAPS
300.0000 mg | ORAL_CAPSULE | Freq: Three times a day (TID) | ORAL | Status: DC
  Administered 2021-01-19 – 2021-01-20 (×3): 300 mg via ORAL
  Filled 2021-01-19 (×3): qty 1

## 2021-01-19 MED ORDER — ALPRAZOLAM 0.5 MG PO TABS
0.5000 mg | ORAL_TABLET | Freq: Once | ORAL | Status: AC
  Administered 2021-01-19: 0.5 mg via ORAL
  Filled 2021-01-19: qty 1

## 2021-01-19 MED ORDER — TRAMADOL HCL 50 MG PO TABS
50.0000 mg | ORAL_TABLET | Freq: Once | ORAL | Status: AC
  Administered 2021-01-19: 50 mg via ORAL
  Filled 2021-01-19: qty 1

## 2021-01-19 MED ORDER — LORAZEPAM 2 MG/ML IJ SOLN
0.5000 mg | Freq: Once | INTRAMUSCULAR | Status: AC
  Administered 2021-01-19: 0.5 mg via INTRAVENOUS
  Filled 2021-01-19: qty 1

## 2021-01-19 NOTE — Plan of Care (Signed)
  Problem: Education: Goal: Knowledge of secondary prevention will improve Outcome: Progressing   Problem: Coping: Goal: Will identify appropriate support needs Outcome: Progressing   Problem: Pain Managment: Goal: General experience of comfort will improve  Problem: Safety: Goal: Ability to remain free from injury will improve  Outcome: Progressing

## 2021-01-19 NOTE — Consult Note (Addendum)
Neurology Consultation  Reason for Consult: continued parasthesias with negative TIA/stroke imaging. Referring Physician: Casper Harrison, MD.   CC: My left hand is numb and tingling.   History is obtained from: patient, chart  HPI: Frank Figueroa is a 65 y.o. male with a PMHx of HTN, CHF, CKD III, previous paraesthesias and leg pain in past, and non adherence to medicines and treatment plans. Patient was admitted to the hospital on 01/17/21 for hypertensive urgency due to non adherence with medications. He reported numbness and tingling in his left hand and a TIA workup was done. Imaging with [facility] showing old lacunar infarct and negative MRI brain. MRA head and neck showed no LVO.  BP was lowered slowly with reinstatement of antihypertensive medications.  Echo showed EF 55-60% with no wall motion abnormalities. Normal LV function. Labs showed creat over 2, LDL of 75, BNP 165.3, and WPY0D 5.6%.  Patient undergoing PT.   However, patient continued to complain of sensation changes to the left hand, so neurology was consulted.   Per patient: He drove across several states about a week ago and his left hand began to feel numb. He attributed this, at that time, to gripping the steering wheel for so many days. However, after returning home and for days after, he still had the numbness. Tingling occurs when touched in hand and sometimes at rest. He states he can feel things with the left hand, but it is just duller than the right hand. The Gabapentin is not helping. He admits to having this in the past treated by other MDs. He has also had left leg pain but no numbness or tingling. Pain is in the hip and knee mostly, but when walking, it feels like he is "walking on a stick". Admits to not being able to navigate stairs as he could in the past. States his left leg feels like it is going to give out sometimes. These symptoms have not kept him from doing his normal activity. He is still independent in activities at  home, bu walks with a cane.  His focus during our entire conversation was about getting narcotics. He stated he was on Opana and Percocet in the past and that has been the "only thing" that has helped his pain to left leg. States he has not had narcotics for at least a year. He moved here 6 mos ago and his new PCP "will not give me pain meds and wants me to go to a pain clinic". He has not seen pain management as now. He has not been evaluated by neurology for these paraesthesias that he has now and in the past.   Patient follows with [facility]. Visit note on 08/16/2020 states he had complained of sensory changes and tingling in left arm which has been "going on for a long time". Note states that he has had left leg weakness and pain for years and he was told in the past that he had a "pinched disk" and might need surgery at some point. Patient had MRI lumbar spine ordered at that OV. The order in chart is still active from 12/21 for Cspine and Lspine MRI, so NP assumes he did not have this done.    ROS: A robust ROS was performed and is negative except as noted in the HPI.   Past Medical History:  Diagnosis Date   Heart failure (HCC)    Hypertension    Low back pain   CKD III   Family History  Problem Relation Age of Onset   Stroke Mother [id number]   Heart attack Father    Cancer Father        [id number] , unknown type - passed away from this cancer   Breast cancer Sister [phone]    Social History:   reports that he has quit smoking. His smoking use included cigarettes. He has never used smokeless tobacco. He reports previous alcohol use. He reports previous drug use.  Medications  Current Facility-Administered Medications:    acetaminophen (TYLENOL) tablet 650 mg, 650 mg, Oral, Q4H PRN, 650 mg at 01/19/21 0245 **OR** acetaminophen (TYLENOL) 160 MG/5ML solution 650 mg, 650 mg, Per Tube, Q4H PRN **OR** acetaminophen (TYLENOL) suppository 650 mg, 650 mg, Rectal, Q4H PRN, Ronaldo MiyamotoKyle, Tyrone A, DO    amLODipine (NORVASC) tablet 5 mg, 5 mg, Oral, Daily, Kyle, Tyrone A, DO, 5 mg at 01/19/21 [id number]   aspirin EC tablet 81 mg, 81 mg, Oral, Daily, Kyle, Tyrone A, DO, 81 mg at 01/19/21 [id number]   furosemide (LASIX) tablet 40 mg, 40 mg, Oral, BID, Kyle, Tyrone A, DO, 40 mg at 01/19/21 0900   gabapentin (NEURONTIN) capsule 300 mg, 300 mg, Oral, TID, Kirby-Graham, Beather ArbourKaren J, NP   hydrALAZINE (APRESOLINE) injection 10 mg, 10 mg, Intravenous, Q8H PRN, Ronaldo MiyamotoKyle, Tyrone A, DO, 10 mg at 01/18/21 0550   irbesartan (AVAPRO) tablet 300 mg, 300 mg, Oral, Daily, Kyle, Tyrone A, DO, 300 mg at 01/19/21 0904   LORazepam (ATIVAN) injection 0.5 mg, 0.5 mg, Intravenous, Once, Cipriano BunkerKumar, Pardeep, MD   senna-docusate (Senokot-S) tablet 1 tablet, 1 tablet, Oral, QHS PRN, Ronaldo MiyamotoKyle, Tyrone A, DO   Exam: Current vital signs: BP (!) 150/82 (BP Location: Left Arm)   Pulse 69   Temp 98.2 F (36.8 C) (Oral)   Resp 18   Ht 5\' 11"  (1.803 m)   Wt 120.2 kg   SpO2 100%   BMI 36.96 kg/m  Vital signs in last 24 hours: Temp:  [97.5 F (36.4 C)-98.4 F (36.9 C)] 98.2 F (36.8 C) (05/15 1155) Pulse Rate:  [62-75] 69 (05/15 1155) Resp:  [16-18] 18 (05/15 1155) BP: (150-164)/(82-103) 150/82 (05/15 1155) SpO2:  [96 %-100 %] 100 % (05/15 1155)  PE: GENERAL: well-appearing male lying in hospital bed. Awake, alert in NAD. HEENT: - Normocephalic and atraumatic LUNGS - Normal respiratory effort.  CV - RRR. ABDOMEN - Soft, nontender Ext: warm, well perfused Psych: He is calm and cooperative but appears frustrated with his situation.   NEURO:  Mental Status: AA&Ox3  Speech/Language: speech is without dysarthria or aphasia. Naming, repetition, fluency, and comprehension intact.  Cranial Nerves:  II: PERRL 443mm/brisk. visual fields full.  III, IV, VI: EOMI. Lid elevation symmetric and full.  V: sensation is intact and symmetrical to face to light touch and sharp stimulation.  VII: Smile is symmetrical. Able to puff cheeks and raise eyebrows.   VIII:hearing intact to voice IX, X: palate elevation is symmetric. Phonation normal.  XI: normal sternocleidomastoid and trapezius muscle strength VWU:[id number]:tongue is symmetrical without fasciculations.   Motor: RUE: grips  5/5       triceps 5/5  biceps  5/5                 LUE: grips 5/5     triceps  5/5    biceps   5/5            RLE:  knee  5/5   thigh  5/5     plantar flexion  5/5  dorsiflexion   5/5            LLE:  knee  5/5   thigh  5/5     plantar flexion  5/5     dorsiflexion   5/5 Tone is normal. Bulk is normal.  Sensation- Intact to light touch bilaterally in all four extremities. Extinction absent to light touch to DSS. Sensation to light touch is decreased in the left hand as compared to the right. However, sharp stimulation is more prominent in the left hand compared to the left. BLEs intact to light touch and sharp stimulation.  Coordination: FTN intact bilaterally. HKS intact bilaterally. No pronator drift.  DTRs: RUE:  biceps 2+     brachioradialis  2+   Triceps 2+            RLE:  patella  2+              LUE:  biceps  2+  Brachioradialis 2+     Triceps 2+            LLE: patella  2+ Gait- deferred  Labs I have reviewed labs in epic and the results pertinent to this consultation are: As per HPI. No TSH or B12.   CBC    Component Value Date/Time   WBC 6.7 01/19/2021 0232   RBC 4.74 01/19/2021 0232   HGB 14.7 01/19/2021 0232   HGB 14.0 07/09/2020 1106   HCT 44.6 01/19/2021 0232   HCT 42.5 07/09/2020 1106   PLT 142 (L) 01/19/2021 0232   PLT 142 (L) 07/09/2020 1106   MCV 94.1 01/19/2021 0232   MCV 94 07/09/2020 1106   MCH 31.0 01/19/2021 0232   MCHC 33.0 01/19/2021 0232   RDW 13.7 01/19/2021 0232   RDW 12.9 07/09/2020 1106   LYMPHSABS 2.0 01/17/2021 0931   LYMPHSABS 2.3 07/09/2020 1106   MONOABS 0.7 01/17/2021 0931   EOSABS 0.2 01/17/2021 0931   EOSABS 0.2 07/09/2020 1106   BASOSABS 0.0 01/17/2021 0931   BASOSABS 0.0 07/09/2020 1106    CMP     Component  Value Date/Time   NA 139 01/19/2021 0232   NA 140 08/16/2020 1006   K 3.7 01/19/2021 0232   CL 109 01/19/2021 0232   CO2 24 01/19/2021 0232   GLUCOSE 100 (H) 01/19/2021 0232   BUN 32 (H) 01/19/2021 0232   BUN 22 08/16/2020 1006   CREATININE 2.04 (H) 01/19/2021 0232   CALCIUM 8.9 01/19/2021 0232   PROT 7.5 01/17/2021 0931   PROT 7.9 07/09/2020 1106   ALBUMIN 3.7 01/17/2021 0931   ALBUMIN 4.4 07/09/2020 1106   AST 39 01/17/2021 0931   ALT 30 01/17/2021 0931   ALKPHOS 76 01/17/2021 0931   BILITOT 0.6 01/17/2021 0931   BILITOT 0.5 07/09/2020 1106   GFRNONAA 36 (L) 01/19/2021 0232   GFRAA 45 (L) 08/16/2020 1006    Lipid Panel     Component Value Date/Time   CHOL 122 01/18/2021 0407   CHOL 163 07/09/2020 1106   TRIG 57 01/18/2021 0407   HDL 36 (L) 01/18/2021 0407   HDL 53 07/09/2020 1106   CHOLHDL 3.4 01/18/2021 0407   VLDL 11 01/18/2021 0407   LDLCALC 75 01/18/2021 0407   LDLCALC 99 07/09/2020 1106     Imaging MD reviewed the images obtained  CT head Chronic small vessel disease throughout the deep white matter.  Old right thalamic lacunar infarct.  No acute intracranial abnormality.  MRI brain 1. Motion degraded, incomplete  examination. 2. No acute infarct. 3. Moderately age advanced chronic small vessel ischemic disease with chronic lacunar infarcts and chronic microhemorrhages as above.    MRA head and neck No proximal intracranial vessel occlusion. Possible marked stenosis of the mid right M1 MCA (could be exaggerated by artifact).  No occlusion or hemodynamically significant stenosis in the neck.   Assessment: 65 yo male who presented with Hypertensive urgency with c/o paresthesias. He underwent TIA workup which was essentially negative. Imaging negative for stroke, bleed, or LVO. Because of continued c/o paresthesias, neurology was asked to consult. He does not exhibit myelopathy on exam and the numbness is not consistent and exam does not concur with his  complaints. Patient has had various paresthesias in the past with leg pain/weakness which is chronic in PCP last OV note. It appears patient has not been adherent with medications or treatment plan with MRI C and L spine ordered in December 2021. Given his complete focus is acquiring pain medications, we feel he is exhibiting drug seeking behavior. Given the lack of consistency in complaints and in exam, he can just f/up outpatient with Neurology with a possible EMG/NCS or spinal imaging for possible disc herniation or various other arthritic findings on imaging. If imaging is positive for surgical intervention, he can be referred to NSU or ortho. It appears that this is what his PCP was working on per last OV note.  -Given symptoms associated with Cspine radiculopathy, will get Cspine MRI.  -He can f/up with PCP about getting his Lspine imaged (which was ordered in December and he was non adherent).    Impression: 1. Paresthesias, varying in location and intensity for years and today only involving the left hand.  2. Left leg pain, also chronic per chart notes.  3. Drug seeking behavior.   Recommendations/Plan:  -Given his numbness and tingling only involves the left hand, we will get MR Cspine to r/o acute process that could cause radiculopathy.  -No evidence of myelopathy on exam. -Set up appt with out patient neurology for consideration of EMG/NCS.  -Increase Neurontin to 300mg  po tid. Can increase dose as needed.   Pt seen by , NP/Neuro and later by MD. Note/plan to be edited by MD as needed.  Pager: Jimmye Norman

## 2021-01-19 NOTE — Progress Notes (Signed)
PROGRESS NOTE    Frank Figueroa  DJM:[id number] DOB: [birth year] DOA: 01/17/2021 PCP: Albertha Ghee, MD  Brief Narrative:  This 65 years old male with PMH significant for HTN presents in the ED with 1 week history of left-sided hand and leg numbness.  Patient reports having these symptoms for a while however this seemed to worsen over the last week.  He initially felt the numbness in the left hand and thought it was due to the gripping on his steering wheel too tight.  This sensation came on and off throughout the week then he developed sensation of standing on the pins in his left leg that persisted throughout the week.  He has not tried any remedies or medications.  He also reported not taking his blood pressure medications for one week. 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  MRI brain was negative for acute intracranial abnormality.  ED physician has spoken with neurology, recommended MRA head and neck.  Assessment & Plan:   Active Problems:   Hypertensive urgency  Hypertensive urgency: Patient presented with BP 218/138 on arrival. Patient reports has not taken medication for last 1 week. Blood pressure goal 140/80, bring down slowly as per neurology. Continue irbesartan, amlodipine .  CT head no acute abnormality. Blood pressure is slowly improving.  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 ED physician had spoken with neurology Dr. Egbert Garibaldi,  recommended MRA to check for stenosis. Recommended complete TIA work-up. MRA Head/ Neck: No proximal intracranial vessel occlusion. Possible marked stenosis of the mid right M1 MCA. No occlusion or hemodynamically significant stenosis in the neck. Echo : LVEF 55 to 60%.  No regional wall motion abnormalities.  Left ventricle has normal function. Patient continued to report having tingling sensation in the arm and leg. Start gabapentin 300 mg 3 times daily. Neurology consult requested,   awaiting repeat recommendation.   CKD stage IIIb: Baseline serum creatinine between 1.8-2.0. Serum creatinine at baseline,  continue Lasix and irbesartan.  Medication noncompliance:   Counseled on importance of taking blood pressure medications.   DVT prophylaxis: SCDs Code Status: Full code. Family Communication:  No family at bedside. Disposition Plan:  Status is: Inpatient  Remains inpatient appropriate because:Inpatient level of care appropriate due to severity of illness   Dispo: The patient is from: Home              Anticipated d/c is to: Home on 5/16              Patient currently is not medically stable to d/c.   Difficult to place patient NO  Consultants:    None  Procedures: MRI head and neck, CT head. Antimicrobials:  Anti-infectives (From admission, onward)   None      Subjective: Patient was seen and examined at bedside.  He reports feeling better,   He still reports having numbness and tingling in the left arm ( feels standing on needles) which remains the same.  Objective: Vitals:   01/18/21 2200 01/19/21 0437 01/19/21 0735 01/19/21 1155  BP: (!) 158/103 (!) 157/94 (!) 164/93 (!) 150/82  Pulse: 75 62 63 69  Resp: 18  16 18   Temp: 98.4 F (36.9 C) (!) 97.5 F (36.4 C) 97.8 F (36.6 C) 98.2 F (36.8 C)  TempSrc: Oral Oral Oral Oral  SpO2: 97% 98% 96% 100%  Weight:      Height:        Intake/Output  Summary (Last 24 hours) at 01/19/2021 1408 Last data filed at 01/19/2021 1300 Gross per 24 hour  Intake 840 ml  Output 1925 ml  Net -1085 ml   Filed Weights   01/17/21 0728  Weight: 120.2 kg    Examination:  General exam: Appears calm and comfortable, not in any acute distress. Respiratory system: Clear to auscultation. Respiratory effort normal. Cardiovascular system: S1 & S2 heard, RRR. No JVD, murmurs, rubs, gallops or clicks. No pedal edema. Gastrointestinal system: Abdomen is nondistended, soft and nontender. No organomegaly or  masses felt.  Normal bowel sounds heard. Central nervous system: Alert and oriented. No focal neurological deficits. Extremities: Symmetric 5 x 5 power.  No edema, no cyanosis no clubbing. Skin: No rashes, lesions or ulcers Psychiatry: Judgement and insight appear normal. Mood & affect appropriate.     Data Reviewed: I have personally reviewed following labs and imaging studies  CBC: Recent Labs  Lab 01/17/21 0931 01/19/21 0232  WBC 7.2 6.7  NEUTROABS 4.3  --   HGB 14.3 14.7  HCT 45.2 44.6  MCV 97.0 94.1  PLT 138* 142*   Basic Metabolic Panel: Recent Labs  Lab 01/17/21 0931 01/19/21 0232  NA 143 139  K 4.5 3.7  CL 112* 109  CO2 26 24  GLUCOSE 99 100*  BUN 24* 32*  CREATININE 2.13* 2.04*  CALCIUM 8.7* 8.9  MG  --  2.2  PHOS  --  4.1   GFR: Estimated Creatinine Clearance: 48.3 mL/min (A) (by C-G formula based on SCr of 2.04 mg/dL (H)). Liver Function Tests: Recent Labs  Lab 01/17/21 0931  AST 39  ALT 30  ALKPHOS 76  BILITOT 0.6  PROT 7.5  ALBUMIN 3.7   No results for input(s): LIPASE, AMYLASE in the last 168 hours. No results for input(s): AMMONIA in the last 168 hours. Coagulation Profile: Recent Labs  Lab 01/17/21 0931  INR 0.9   Cardiac Enzymes: No results for input(s): CKTOTAL, CKMB, CKMBINDEX, TROPONINI in the last 168 hours. BNP (last 3 results) No results for input(s): PROBNP in the last 8760 hours. HbA1C: Recent Labs    01/18/21 0407  HGBA1C 5.6   CBG: No results for input(s): GLUCAP in the last 168 hours. Lipid Profile: Recent Labs    01/18/21 0407  CHOL 122  HDL 36*  LDLCALC 75  TRIG 57  CHOLHDL 3.4   Thyroid Function Tests: No results for input(s): TSH, T4TOTAL, FREET4, T3FREE, THYROIDAB in the last 72 hours. Anemia Panel: No results for input(s): VITAMINB12, FOLATE, FERRITIN, TIBC, IRON, RETICCTPCT in the last 72 hours. Sepsis Labs: No results for input(s): PROCALCITON, LATICACIDVEN in the last 168 hours.  Recent  Results (from the past 240 hour(s))  Resp Panel by RT-PCR (Flu A&B, Covid) Nasopharyngeal Swab     Status: None   Collection Time: 01/17/21  8:29 AM   Specimen: Nasopharyngeal Swab; Nasopharyngeal(NP) swabs in vial transport medium  Result Value Ref Range Status   SARS Coronavirus 2 by RT PCR NEGATIVE NEGATIVE Final    Comment: (NOTE) SARS-CoV-2 target nucleic acids are NOT DETECTED.  The SARS-CoV-2 RNA is generally detectable in upper respiratory specimens during the acute phase of infection. The lowest concentration of SARS-CoV-2 viral copies this assay can detect is 138 copies/mL. A negative result does not preclude SARS-Cov-2 infection and should not be used as the sole basis for treatment or other patient management decisions. A negative result may occur with  improper specimen collection/handling, submission of specimen other than nasopharyngeal  swab, presence of viral mutation(s) within the areas targeted by this assay, and inadequate number of viral copies(<138 copies/mL). A negative result must be combined with clinical observations, patient history, and epidemiological information. The expected result is Negative.  Fact Sheet for Patients:  BloggerCourse.com  Fact Sheet for Healthcare Providers:  SeriousBroker.it  This test is no t yet approved or cleared by the [place] FDA and  has been authorized for detection and/or diagnosis of SARS-CoV-2 by FDA under an Emergency Use Authorization (EUA). This EUA will remain  in effect (meaning this test can be used) for the duration of the COVID-19 declaration under Section 564(b)(1) of the Act, 21 U.S.C.section 360bbb-3(b)(1), unless the authorization is terminated  or revoked sooner.       Influenza A by PCR NEGATIVE NEGATIVE Final   Influenza B by PCR NEGATIVE NEGATIVE Final    Comment: (NOTE) The Xpert Xpress SARS-CoV-2/FLU/RSV plus assay is intended as an aid in the  diagnosis of influenza from Nasopharyngeal swab specimens and should not be used as a sole basis for treatment. Nasal washings and aspirates are unacceptable for Xpert Xpress SARS-CoV-2/FLU/RSV testing.  Fact Sheet for Patients: BloggerCourse.com  Fact Sheet for Healthcare Providers: SeriousBroker.it  This test is not yet approved or cleared by the [place] FDA and has been authorized for detection and/or diagnosis of SARS-CoV-2 by FDA under an Emergency Use Authorization (EUA). This EUA will remain in effect (meaning this test can be used) for the duration of the COVID-19 declaration under Section 564(b)(1) of the Act, 21 U.S.C. section 360bbb-3(b)(1), unless the authorization is terminated or revoked.  Performed at [facility], [address], [place], [place] [address]     Radiology Studies: ECHOCARDIOGRAM COMPLETE  Result Date: 01/18/2021    ECHOCARDIOGRAM REPORT   Patient Name:   [facility] Date of Exam: 01/18/2021 Medical Rec #:  [id number]      Height:       71.0 in Accession #:    [id number]     Weight:       265.0 lb Date of Birth:  05/16/1956       BSA:          2.376 m Patient Age:    65 years       BP:           164/98 mmHg Patient Gender: M              HR:           82 bpm. Exam Location:  Inpatient Procedure: 2D Echo, Cardiac Doppler and Color Doppler Indications:    TIA G45.9  History:        Patient has no prior history of Echocardiogram examinations.                 CHF; Risk Factors:Hypertension.  Sonographer:    Tiffany Dance Referring Phys: [id number] Teddy Spike IMPRESSIONS  1. Left ventricular ejection fraction, by estimation, is 55 to 60%. The left ventricle has normal function. The left ventricle has no regional wall motion abnormalities. There is moderate asymmetric left ventricular hypertrophy of the septal and basal segments. Left ventricular diastolic parameters are indeterminate.  2.  Right ventricular systolic function is normal. The right ventricular size is normal. Tricuspid regurgitation signal is inadequate for assessing PA pressure.  3. The mitral valve is grossly normal. No evidence of mitral valve regurgitation.  4. The aortic valve is tricuspid. Aortic valve regurgitation is mild. Aortic  regurgitation PHT measures 688 msec.  5. The inferior vena cava is normal in size with greater than 50% respiratory variability, suggesting right atrial pressure of 3 mmHg. FINDINGS  Left Ventricle: Left ventricular ejection fraction, by estimation, is 55 to 60%. The left ventricle has normal function. The left ventricle has no regional wall motion abnormalities. The left ventricular internal cavity size was normal in size. There is  moderate asymmetric left ventricular hypertrophy of the septal and basal segments. Left ventricular diastolic parameters are indeterminate. Right Ventricle: The right ventricular size is normal. No increase in right ventricular wall thickness. Right ventricular systolic function is normal. Tricuspid regurgitation signal is inadequate for assessing PA pressure. Left Atrium: Left atrial size was normal in size. Right Atrium: Right atrial size was normal in size. Pericardium: There is no evidence of pericardial effusion. Mitral Valve: The mitral valve is grossly normal. No evidence of mitral valve regurgitation. Tricuspid Valve: The tricuspid valve is grossly normal. Tricuspid valve regurgitation is trivial. Aortic Valve: The aortic valve is tricuspid. Aortic valve regurgitation is mild. Aortic regurgitation PHT measures 688 msec. Pulmonic Valve: The pulmonic valve was grossly normal. Pulmonic valve regurgitation is trivial. Aorta: The aortic root is normal in size and structure. Venous: The inferior vena cava is normal in size with greater than 50% respiratory variability, suggesting right atrial pressure of 3 mmHg. IAS/Shunts: No atrial level shunt detected by color flow  Doppler.  LEFT VENTRICLE PLAX 2D LVIDd:         4.30 cm  Diastology LVIDs:         3.40 cm  LV e' medial:    5.11 cm/s LV PW:         1.20 cm  LV E/e' medial:  12.3 LV IVS:        1.70 cm  LV e' lateral:   5.87 cm/s LVOT diam:     2.20 cm  LV E/e' lateral: 10.7 LV SV:         76 LV SV Index:   32 LVOT Area:     3.80 cm  RIGHT VENTRICLE            IVC RV Basal diam:  3.00 cm    IVC diam: 1.70 cm RV Mid diam:    1.70 cm RV S prime:     9.68 cm/s TAPSE (M-mode): 1.9 cm LEFT ATRIUM             Index       RIGHT ATRIUM           Index LA diam:        4.00 cm 1.68 cm/m  RA Area:     13.60 cm LA Vol (A2C):   49.2 ml 20.70 ml/m RA Volume:   30.60 ml  12.88 ml/m LA Vol (A4C):   52.6 ml 22.13 ml/m LA Biplane Vol: 50.6 ml 21.29 ml/m  AORTIC VALVE LVOT Vmax:   91.85 cm/s LVOT Vmean:  63.350 cm/s LVOT VTI:    0.201 m AI PHT:      688 msec  AORTA Ao Root diam: 3.80 cm Ao Asc diam:  3.80 cm MITRAL VALVE MV Area (PHT): 3.17 cm    SHUNTS MV Decel Time: 239 msec    Systemic VTI:  0.20 m MV E velocity: 63.00 cm/s  Systemic Diam: 2.20 cm MV A velocity: 95.50 cm/s MV E/A ratio:  0.66 Nona Dell MD Electronically signed by Nona Dell MD Signature Date/Time: 01/18/2021/1:05:12 PM    Final  Scheduled Meds: . amLODipine  5 mg Oral Daily  . aspirin EC  81 mg Oral Daily  . furosemide  40 mg Oral BID  . gabapentin  300 mg Oral TID  . irbesartan  300 mg Oral Daily   Continuous Infusions:   LOS: 2 days    Time spent: 25 mins    Cipriano Bunker, MD [facility]   If 7PM-7AM, please contact night-coverage

## 2021-01-19 NOTE — Progress Notes (Signed)
MD Toniann Fail was paged because patient is c/o left leg pain and wants something for pain.

## 2021-01-20 ENCOUNTER — Other Ambulatory Visit (HOSPITAL_COMMUNITY): Payer: Self-pay

## 2021-01-20 LAB — BASIC METABOLIC PANEL
Anion gap: 7 (ref 5–15)
BUN: 37 mg/dL — ABNORMAL HIGH (ref 8–23)
CO2: 25 mmol/L (ref 22–32)
Calcium: 8.8 mg/dL — ABNORMAL LOW (ref 8.9–10.3)
Chloride: 107 mmol/L (ref 98–111)
Creatinine, Ser: 2.2 mg/dL — ABNORMAL HIGH (ref 0.61–1.24)
GFR, Estimated: 33 mL/min — ABNORMAL LOW (ref >60)
Glucose, Bld: 105 mg/dL — ABNORMAL HIGH (ref 70–99)
Potassium: 3.4 mmol/L — ABNORMAL LOW (ref 3.5–5.1)
Sodium: 139 mmol/L (ref 135–145)

## 2021-01-20 MED ORDER — OXYCODONE-ACETAMINOPHEN 5-325 MG PO TABS
1.0000 | ORAL_TABLET | Freq: Four times a day (QID) | ORAL | 0 refills | Status: DC | PRN
  Filled 2021-01-20: qty 10, 3d supply, fill #0

## 2021-01-20 MED ORDER — POTASSIUM CHLORIDE 20 MEQ PO PACK
40.0000 meq | PACK | Freq: Once | ORAL | Status: AC
  Administered 2021-01-20: 40 meq via ORAL
  Filled 2021-01-20: qty 2

## 2021-01-20 MED ORDER — ASPIRIN 81 MG PO TBEC
81.0000 mg | DELAYED_RELEASE_TABLET | Freq: Every day | ORAL | 11 refills | Status: DC
  Filled 2021-01-20: qty 30, 30d supply, fill #0

## 2021-01-20 MED ORDER — GABAPENTIN 300 MG PO CAPS
300.0000 mg | ORAL_CAPSULE | Freq: Three times a day (TID) | ORAL | 0 refills | Status: DC
  Filled 2021-01-20: qty 90, 30d supply, fill #0

## 2021-01-20 NOTE — TOC Progression Note (Signed)
Transition of Care [facility]) - Progression Note    Patient Details  Name: Jerrik Housholder MRN: [id number] Date of Birth: [birth year]  Transition of [facility] CM/SW Contact  Geni Bers, RN Phone Number: 01/20/2021, 2:38 PM  Clinical Narrative:    RW to be delivered to room.         Expected Discharge Plan and Services           Expected Discharge Date: 01/20/21                                     Social Determinants of Health (SDOH) Interventions    Readmission Risk Interventions No flowsheet data found.

## 2021-01-20 NOTE — Discharge Instructions (Signed)
Advised to follow up PCP in one week. Advised to follow up Neurosurgery in one week. Advised to take percocet as needed for severe pain.

## 2021-01-20 NOTE — Discharge Summary (Signed)
Physician Discharge Summary  Frank Figueroa ZOX:[id number] DOB: 07/28/1956 DOA: 01/17/2021  PCP: Albertha Ghee, MD  Admit date: 01/17/2021   Discharge date: 01/20/2021  Admitted From: Home.  Disposition:  Home  Recommendations for Outpatient Follow-up:  1. Follow up with PCP in 1-2 weeks. 2. Please obtain BMP/CBC in one week. 3. Advised to follow up Neurosurgery in one week. 4. Advised to take percocet as needed for severe pain.  Home Health: None. Equipment/Devices: None  Discharge Condition: Stable CODE STATUS:Full code Diet recommendation: Heart Healthy  Brief Summary / Hospital Course: This 65 years old male with PMH significant for HTN presents in the ED with one week history of left-sided hand and leg numbness. Patient reports having these symptoms for couple of years however this seemed to have worsened over the last week.  He initially felt the numbness in the left hand and thought it was due to the gripping on his steering wheel too tight.  This sensation came on and off throughout the week then he developed sensation of standing on the needles in his left leg that persisted throughout the week.  He has not tried any remedies or medications.  He also reported not taking his blood pressure medications for one week. 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  MRI brain was negative for acute intracranial abnormality.  ED physician has spoken with neurology, recommended MRA head and neck. Patient was admitted for hypertensive urgency with complaints of paresthesia.  TIA work-up has been unremarkable.  Imaging negative for stroke, bleed, large vessel occlusion.  Because of continued complaints of paresthesia,  neurology was consulted.  Patient was exhibiting drug-seeking behavior.  He was more focused on getting Percocet then resolution of symptoms.  Patient underwent MRI C-spine which showed multiple levels of spinal cord narrowing.  Neurology recommended neurosurgical evaluation.   Case discussed with Dr. Franky Macho( Neurosurgery) over the phone. He recommended since patient's symptoms are chronic, patient can be discharged on pain medication and patient will follow-up neurosurgery outpatient in 1 week.  Patient has ambulated in the hallway with walker which was provided.  Patient feels better and want to be discharged.  Patient is being discharged home.  He was managed for below problems.   Discharge Diagnoses:  Active Problems:   Hypertensive urgency   Hypertensive urgency: > Improved. Patient presented with BP 218/138 on arrival. Patient reports has not taken medication for last 1 week. Blood pressure goal 140/80, bring down slowly as per neurology. Continue irbesartan, amlodipine .  CT head no acute abnormality. Blood pressure has improved.  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 ED physician had spoken with neurology Dr. Egbert Garibaldi,  recommended MRA to check for stenosis. Recommended complete TIA work-up. MRA Head/ Neck: No proximal intracranial vessel occlusion. Possible marked stenosis of the mid right M1 MCA. No occlusion or hemodynamically significant stenosis in the neck. Echo : LVEF 55 to 60%.  No regional wall motion abnormalities.  Left ventricle has normal function. Patient continued to report having tingling sensation in the arm and leg. Increased gabapentin 300 mg 3 times daily. Neurology consult requested,   recommended MRI C-spine. MRI C spine showed multiple levels of spinal cord narrowing,  neurosurgery consulted. Recommended outpatient follow-up since patient symptoms are chronic.   CKD stage IIIb: Baseline serum creatinine between 1.8-2.0. Serum creatinine at baseline,  continue Lasix and irbesartan.  Medication noncompliance:   Counseled on importance of taking blood pressure  medications.   Discharge Instructions  Discharge Instructions    Call MD for:  difficulty breathing,  headache or visual disturbances   Complete by: As directed    Call MD for:  persistant dizziness or light-headedness   Complete by: As directed    Call MD for:  persistant nausea and vomiting   Complete by: As directed    Call MD for:  severe uncontrolled pain   Complete by: As directed    Diet - low sodium heart healthy   Complete by: As directed    Diet Carb Modified   Complete by: As directed    Discharge instructions   Complete by: As directed    Advised to follow up PCP in one week. Advised to follow up Neurosurgery in one week. Advised to take percocet as needed for severe pain.   Increase activity slowly   Complete by: As directed      Allergies as of 01/20/2021      Reactions   Tape       Medication List    STOP taking these medications   tetrahydrozoline-zinc 0.05-0.25 % ophthalmic solution Commonly known as: VISINE-AC     TAKE these medications   amLODipine 5 MG tablet Commonly known as: NORVASC TAKE 1 TABLET(5 MG) BY MOUTH DAILY What changed: See the new instructions.   aspirin 81 MG EC tablet Take 1 tablet (81 mg total) by mouth daily. Swallow whole. Start taking on: Jan 21, 2021   furosemide 40 MG tablet Commonly known as: LASIX TAKE 1 TABLET(40 MG) BY MOUTH TWICE DAILY What changed: See the new instructions.   gabapentin 300 MG capsule Commonly known as: NEURONTIN Take 1 capsule (300 mg total) by mouth 3 (three) times daily. What changed:   medication strength  how much to take   olmesartan 40 MG tablet Commonly known as: BENICAR TAKE 1 TABLET(40 MG) BY MOUTH DAILY What changed: See the new instructions.   oxyCODONE-acetaminophen 5-325 MG tablet Commonly known as: Percocet Take 1 tablet by mouth every 6 (six) hours as needed for up to 3 days for severe pain.       Follow-up Information    Albertha Ghee, MD Follow up in 1 week(s).   Specialty: Internal Medicine Contact information: [address] [place]  [address] [phone]        Coletta Memos, MD Follow up in 1 week(s).   Specialty: Neurosurgery Contact information: [address] [place] [address] [phone]              Allergies  Allergen Reactions  . Tape     Consultations:  Neurology  Neurosurgery   Procedures/Studies: CT HEAD WO CONTRAST  Result Date: 01/17/2021 CLINICAL DATA:  Left-sided weakness EXAM: CT HEAD WITHOUT CONTRAST TECHNIQUE: Contiguous axial images were obtained from the base of the skull through the vertex without intravenous contrast. COMPARISON:  None. FINDINGS: Brain: Old right thalamic lacunar infarct. Chronic small vessel disease throughout the deep white matter. No acute intracranial abnormality. Specifically, no hemorrhage, hydrocephalus, mass lesion, acute infarction, or significant intracranial injury. Vascular: No hyperdense vessel or unexpected calcification. Skull: No acute calvarial abnormality. Sinuses/Orbits: No acute findings Other: None IMPRESSION: Chronic small vessel disease throughout the deep white matter. Old right thalamic lacunar infarct. No acute intracranial abnormality. Electronically Signed   By: Charlett Nose M.D.   On: 01/17/2021 08:22   MR ANGIO HEAD WO CONTRAST  Result Date: 01/17/2021 CLINICAL DATA:  Left-sided weakness  EXAM: MRA HEAD WITHOUT CONTRAST MRA NECK WITHOUT CONTRAST TECHNIQUE: Angiographic images of the Circle of Willis were obtained using MRA technique without intravenous contrast. Angiographic images of the neck were obtained using MRA technique without intravenous contrast. Carotid stenosis measurements (when applicable) are obtained utilizing NASCET criteria, using the distal internal carotid diameter as the denominator. COMPARISON:  None. FINDINGS: MRA HEAD FINDINGS Intracranial internal carotid arteries are patent. Middle and anterior cerebral arteries are patent. Possible marked focal right M1 MCA stenosis. Intracranial vertebral  arteries, basilar artery, posterior cerebral arteries are patent. Bilateral posterior communicating arteries are present. There is no aneurysm. MRA NECK FINDINGS Common, internal, and external carotid arteries are patent extracranial vertebral arteries are patent. No hemodynamically significant stenosis or evidence of dissection. IMPRESSION: No proximal intracranial vessel occlusion. Possible marked stenosis of the mid right M1 MCA (could be exaggerated by artifact). No occlusion or hemodynamically significant stenosis in the neck. Electronically Signed   By: Guadlupe Spanish M.D.   On: 01/17/2021 14:18   MR ANGIO NECK WO CONTRAST  Result Date: 01/17/2021 CLINICAL DATA:  Left-sided weakness EXAM: MRA HEAD WITHOUT CONTRAST MRA NECK WITHOUT CONTRAST TECHNIQUE: Angiographic images of the Circle of Willis were obtained using MRA technique without intravenous contrast. Angiographic images of the neck were obtained using MRA technique without intravenous contrast. Carotid stenosis measurements (when applicable) are obtained utilizing NASCET criteria, using the distal internal carotid diameter as the denominator. COMPARISON:  None. FINDINGS: MRA HEAD FINDINGS Intracranial internal carotid arteries are patent. Middle and anterior cerebral arteries are patent. Possible marked focal right M1 MCA stenosis. Intracranial vertebral arteries, basilar artery, posterior cerebral arteries are patent. Bilateral posterior communicating arteries are present. There is no aneurysm. MRA NECK FINDINGS Common, internal, and external carotid arteries are patent extracranial vertebral arteries are patent. No hemodynamically significant stenosis or evidence of dissection. IMPRESSION: No proximal intracranial vessel occlusion. Possible marked stenosis of the mid right M1 MCA (could be exaggerated by artifact). No occlusion or hemodynamically significant stenosis in the neck. Electronically Signed   By: Guadlupe Spanish M.D.   On: 01/17/2021  14:18   MR BRAIN WO CONTRAST  Result Date: 01/17/2021 CLINICAL DATA:  Left-sided numbness/tingling for 7 days with weakness. EXAM: MRI HEAD WITHOUT CONTRAST TECHNIQUE: Multiplanar, multiecho pulse sequences of the brain and surrounding structures were obtained without intravenous contrast. COMPARISON:  Head CT 01/17/2021 FINDINGS: The patient was unable to tolerate the complete examination. Axial and coronal diffusion, sagittal T1, axial FLAIR, axial T2, and axial T2* gradient echo sequences were obtained. The study is motion degraded including severe motion on the FLAIR sequence. Brain: There is no evidence of an acute infarct, mass, midline shift, or extra-axial fluid collection. There are chronic lacunar infarcts with associated chronic blood products in the thalami (right larger than left) and right paramedian pons. Additional chronic microhemorrhages are noted deep in the cerebral hemispheres and brainstem suggesting chronic hypertension. Patchy T2 hyperintensities in the cerebral white matter bilaterally are nonspecific but compatible with moderately age advanced chronic small vessel ischemic disease. There is mild cerebral atrophy. Vascular: Major intracranial vascular flow voids are preserved. Skull and upper cervical spine: No suspicious marrow lesion. Sinuses/Orbits: Unremarkable orbits. Small mucous retention cyst in the right maxillary sinus. Clear mastoid air cells. Other: None. IMPRESSION: 1. Motion degraded, incomplete examination. 2. No acute infarct. 3. Moderately age advanced chronic small vessel ischemic disease with chronic lacunar infarcts and chronic microhemorrhages as above. Electronically Signed   By: Sebastian Ache M.D.   On:  01/17/2021 11:10   MR CERVICAL SPINE WO CONTRAST  Result Date: 01/19/2021 CLINICAL DATA:  Numbness or tingling, paresthesia. Numbness and tingling to left upper extremity. EXAM: MRI CERVICAL SPINE WITHOUT CONTRAST TECHNIQUE: Multiplanar, multisequence MR imaging  of the cervical spine was performed. No intravenous contrast was administered. COMPARISON:  MRA neck 01/17/2021. FINDINGS: Mild intermittent motion degradation. Alignment: Straightening of the expected cervical lordosis. 2 mm C3-C4 grade 1 retrolisthesis Vertebrae: Cervical vertebral body height is maintained. Mild chronic T1 and T2 superior endplate compression deformities. Mild multilevel degenerative endplate irregularity greatest at C6-C7. Mild degenerative endplate edema present at C5-C6 and C6-C7. Cord: No definite spinal cord signal abnormality is identified. However, evaluation is limited by motion degradation. Posterior Fossa, vertebral arteries, paraspinal tissues: No abnormality identified within included portions of the posterior fossa. Flow voids preserved within the imaged cervical vertebral arteries. Paraspinal soft tissues within normal limits. Disc levels: Multilevel disc degeneration. Most notably, there is moderate disc degeneration at C3-C4 and moderate to moderately advanced disc degeneration at C6-C7. C2-C3: No significant disc herniation or stenosis. C3-C4: Disc bulge with endplate spurring and bilateral disc osteophyte ridge/uncinate hypertrophy. Superimposed small central disc protrusion. Severe spinal canal stenosis with significant spinal cord flattening. Severe bilateral neural foraminal narrowing. C4-C5: Shallow disc bulge. Superimposed small central disc protrusion. Endplate spurring with bilateral disc osteophyte ridge/uncinate hypertrophy. Facet arthrosis. Moderate/severe spinal canal stenosis with mild spinal cord flattening. Bilateral neural foraminal narrowing (mild right, moderate/severe left). C5-C6: Shallow disc bulge. Right-sided disc osteophyte ridge/uncinate hypertrophy. Minimal facet arthrosis. No significant spinal canal stenosis. Moderate/severe right neural foraminal narrowing. C6-C7: Disc bulge with right greater than left disc osteophyte ridge/uncinate hypertrophy.  Mild/moderate spinal canal narrowing. The disc bulge contacts and mildly flattens the ventral spinal cord. Bilateral neural foraminal narrowing (severe right, moderate/severe left). C7-T1: No significant disc herniation or spinal canal stenosis. Mild facet arthrosis. Mild relative right neural foraminal narrowing. IMPRESSION: Cervical spondylosis, as outlined and with findings most notably as follows. At C3-C4, there is moderate disc degeneration. Multifactorial severe spinal canal stenosis with significant spinal cord flattening. No definite spinal cord signal abnormality is identified, although mild motion degradation limits evaluation. Severe bilateral neural foraminal narrowing. At C4-C5, there is multifactorial moderate/severe spinal canal stenosis with mild spinal cord flattening. Bilateral neural foraminal narrowing (mild right, moderate/severe left). At C6-C7, there is moderate to moderately advanced disc degeneration. Multifactorial mild/moderate spinal canal stenosis. A disc bulge contacts and mildly flattens the ventral spinal cord. Bilateral neural foraminal narrowing (severe right, moderate/severe left). Mild degenerative endplate edema also present at this level. Mild chronic T1 and T2 vertebral superior endplate compression deformities. Electronically Signed   By: Jackey Loge DO   On: 01/19/2021 17:07   ECHOCARDIOGRAM COMPLETE  Result Date: 01/18/2021    ECHOCARDIOGRAM REPORT   Patient Name:   [facility] Date of Exam: 01/18/2021 Medical Rec #:  [id number]      Height:       71.0 in Accession #:    [id number]     Weight:       265.0 lb Date of Birth:  05-05-1956       BSA:          2.376 m Patient Age:    65 years       BP:           164/98 mmHg Patient Gender: M              HR:  82 bpm. Exam Location:  Inpatient Procedure: 2D Echo, Cardiac Doppler and Color Doppler Indications:    TIA G45.9  History:        Patient has no prior history of Echocardiogram examinations.                  CHF; Risk Factors:Hypertension.  Sonographer:    Tiffany Dance Referring Phys: [id number] Teddy Spike IMPRESSIONS  1. Left ventricular ejection fraction, by estimation, is 55 to 60%. The left ventricle has normal function. The left ventricle has no regional wall motion abnormalities. There is moderate asymmetric left ventricular hypertrophy of the septal and basal segments. Left ventricular diastolic parameters are indeterminate.  2. Right ventricular systolic function is normal. The right ventricular size is normal. Tricuspid regurgitation signal is inadequate for assessing PA pressure.  3. The mitral valve is grossly normal. No evidence of mitral valve regurgitation.  4. The aortic valve is tricuspid. Aortic valve regurgitation is mild. Aortic regurgitation PHT measures 688 msec.  5. The inferior vena cava is normal in size with greater than 50% respiratory variability, suggesting right atrial pressure of 3 mmHg. FINDINGS  Left Ventricle: Left ventricular ejection fraction, by estimation, is 55 to 60%. The left ventricle has normal function. The left ventricle has no regional wall motion abnormalities. The left ventricular internal cavity size was normal in size. There is  moderate asymmetric left ventricular hypertrophy of the septal and basal segments. Left ventricular diastolic parameters are indeterminate. Right Ventricle: The right ventricular size is normal. No increase in right ventricular wall thickness. Right ventricular systolic function is normal. Tricuspid regurgitation signal is inadequate for assessing PA pressure. Left Atrium: Left atrial size was normal in size. Right Atrium: Right atrial size was normal in size. Pericardium: There is no evidence of pericardial effusion. Mitral Valve: The mitral valve is grossly normal. No evidence of mitral valve regurgitation. Tricuspid Valve: The tricuspid valve is grossly normal. Tricuspid valve regurgitation is trivial. Aortic Valve: The aortic valve is  tricuspid. Aortic valve regurgitation is mild. Aortic regurgitation PHT measures 688 msec. Pulmonic Valve: The pulmonic valve was grossly normal. Pulmonic valve regurgitation is trivial. Aorta: The aortic root is normal in size and structure. Venous: The inferior vena cava is normal in size with greater than 50% respiratory variability, suggesting right atrial pressure of 3 mmHg. IAS/Shunts: No atrial level shunt detected by color flow Doppler.  LEFT VENTRICLE PLAX 2D LVIDd:         4.30 cm  Diastology LVIDs:         3.40 cm  LV e' medial:    5.11 cm/s LV PW:         1.20 cm  LV E/e' medial:  12.3 LV IVS:        1.70 cm  LV e' lateral:   5.87 cm/s LVOT diam:     2.20 cm  LV E/e' lateral: 10.7 LV SV:         76 LV SV Index:   32 LVOT Area:     3.80 cm  RIGHT VENTRICLE            IVC RV Basal diam:  3.00 cm    IVC diam: 1.70 cm RV Mid diam:    1.70 cm RV S prime:     9.68 cm/s TAPSE (M-mode): 1.9 cm LEFT ATRIUM             Index       RIGHT ATRIUM  Index LA diam:        4.00 cm 1.68 cm/m  RA Area:     13.60 cm LA Vol (A2C):   49.2 ml 20.70 ml/m RA Volume:   30.60 ml  12.88 ml/m LA Vol (A4C):   52.6 ml 22.13 ml/m LA Biplane Vol: 50.6 ml 21.29 ml/m  AORTIC VALVE LVOT Vmax:   91.85 cm/s LVOT Vmean:  63.350 cm/s LVOT VTI:    0.201 m AI PHT:      688 msec  AORTA Ao Root diam: 3.80 cm Ao Asc diam:  3.80 cm MITRAL VALVE MV Area (PHT): 3.17 cm    SHUNTS MV Decel Time: 239 msec    Systemic VTI:  0.20 m MV E velocity: 63.00 cm/s  Systemic Diam: 2.20 cm MV A velocity: 95.50 cm/s MV E/A ratio:  0.66 Nona DellSamuel Mcdowell MD Electronically signed by Nona DellSamuel Mcdowell MD Signature Date/Time: 01/18/2021/1:05:12 PM    Final     CT head, MRI head and neck, MRA head and neck, MRI C-spine, echocardiogram   Subjective: Patient was seen and examined at bedside.  He continues to have left arm numbness and pain.   He reports feeling better but pain is still persistent, he wants to be discharged and will follow up with  neurosurgery as an outpatient.  Discharge Exam: Vitals:   01/20/21 0400 01/20/21 1207  BP: (!) 148/90 (!) 156/89  Pulse: 62 69  Resp: 18 16  Temp: 97.7 F (36.5 C) 97.9 F (36.6 C)  SpO2: 97% 98%   Vitals:   01/19/21 2100 01/20/21 0009 01/20/21 0400 01/20/21 1207  BP: (!) 172/90 (!) 149/86 (!) 148/90 (!) 156/89  Pulse: 71 70 62 69  Resp: 18 18 18 16   Temp: 98 F (36.7 C) 97.8 F (36.6 C) 97.7 F (36.5 C) 97.9 F (36.6 C)  TempSrc: Oral Oral Oral Oral  SpO2: 99% 98% 97% 98%  Weight:      Height:        General: Pt is alert, awake, not in acute distress Cardiovascular: RRR, S1/S2 +, no rubs, no gallops Respiratory: CTA bilaterally, no wheezing, no rhonchi Abdominal: Soft, NT, ND, bowel sounds + Extremities: no edema, no cyanosis    The results of significant diagnostics from this hospitalization (including imaging, microbiology, ancillary and laboratory) are listed below for reference.     Microbiology: Recent Results (from the past 240 hour(s))  Resp Panel by RT-PCR (Flu A&B, Covid) Nasopharyngeal Swab     Status: None   Collection Time: 01/17/21  8:29 AM   Specimen: Nasopharyngeal Swab; Nasopharyngeal(NP) swabs in vial transport medium  Result Value Ref Range Status   SARS Coronavirus 2 by RT PCR NEGATIVE NEGATIVE Final    Comment: (NOTE) SARS-CoV-2 target nucleic acids are NOT DETECTED.  The SARS-CoV-2 RNA is generally detectable in upper respiratory specimens during the acute phase of infection. The lowest concentration of SARS-CoV-2 viral copies this assay can detect is 138 copies/mL. A negative result does not preclude SARS-Cov-2 infection and should not be used as the sole basis for treatment or other patient management decisions. A negative result may occur with  improper specimen collection/handling, submission of specimen other than nasopharyngeal swab, presence of viral mutation(s) within the areas targeted by this assay, and inadequate number of  viral copies(<138 copies/mL). A negative result must be combined with clinical observations, patient history, and epidemiological information. The expected result is Negative.  Fact Sheet for Patients:  BloggerCourse.comhttps://www.fda.gov/media/152166/download  Fact Sheet for Healthcare Providers:  SeriousBroker.it  This test is no t yet approved or cleared by the [place] and  has been authorized for detection and/or diagnosis of SARS-CoV-2 by FDA under an Emergency Use Authorization (EUA). This EUA will remain  in effect (meaning this test can be used) for the duration of the COVID-19 declaration under Section 564(b)(1) of the Act, 21 U.S.C.section 360bbb-3(b)(1), unless the authorization is terminated  or revoked sooner.       Influenza A by PCR NEGATIVE NEGATIVE Final   Influenza B by PCR NEGATIVE NEGATIVE Final    Comment: (NOTE) The Xpert Xpress SARS-CoV-2/FLU/RSV plus assay is intended as an aid in the diagnosis of influenza from Nasopharyngeal swab specimens and should not be used as a sole basis for treatment. Nasal washings and aspirates are unacceptable for Xpert Xpress SARS-CoV-2/FLU/RSV testing.  Fact Sheet for Patients: BloggerCourse.com  Fact Sheet for Healthcare Providers: SeriousBroker.it  This test is not yet approved or cleared by the [place] FDA and has been authorized for detection and/or diagnosis of SARS-CoV-2 by FDA under an Emergency Use Authorization (EUA). This EUA will remain in effect (meaning this test can be used) for the duration of the COVID-19 declaration under Section 564(b)(1) of the Act, 21 U.S.C. section 360bbb-3(b)(1), unless the authorization is terminated or revoked.  Performed at [facility], [address], [place], [place] [address]      Labs: BNP (last 3 results) Recent Labs    07/19/20 1411 01/17/21 0931  BNP 62.2 165.3*    Basic Metabolic Panel: Recent Labs  Lab 01/17/21 0931 01/19/21 0232 01/20/21 0404  NA 143 139 139  K 4.5 3.7 3.4*  CL 112* 109 107  CO2 GLUCOSE 99 100* 105*  BUN 24* 32* 37*  CREATININE 2.13* 2.04* 2.20*  CALCIUM 8.7* 8.9 8.8*  MG  --  2.2  --   PHOS  --  4.1  --    Liver Function Tests: Recent Labs  Lab 01/17/21 0931  AST 39  ALT 30  ALKPHOS 76  BILITOT 0.6  PROT 7.5  ALBUMIN 3.7   No results for input(s): LIPASE, AMYLASE in the last 168 hours. No results for input(s): AMMONIA in the last 168 hours. CBC: Recent Labs  Lab 01/17/21 0931 01/19/21 0232  WBC 7.2 6.7  NEUTROABS 4.3  --   HGB 14.3 14.7  HCT 45.2 44.6  MCV 97.0 94.1  PLT 138* 142*   Cardiac Enzymes: No results for input(s): CKTOTAL, CKMB, CKMBINDEX, TROPONINI in the last 168 hours. BNP: Invalid input(s): POCBNP CBG: No results for input(s): GLUCAP in the last 168 hours. D-Dimer No results for input(s): DDIMER in the last 72 hours. Hgb A1c Recent Labs    01/18/21 0407  HGBA1C 5.6   Lipid Profile Recent Labs    01/18/21 0407  CHOL 122  HDL 36*  LDLCALC 75  TRIG 57  CHOLHDL 3.4   Thyroid function studies Recent Labs    01/19/21 1721  TSH 0.748   Anemia work up Recent Labs    01/19/21 1721  VITAMINB12 570   Urinalysis    Component Value Date/Time   COLORURINE YELLOW 01/17/2021 0832   APPEARANCEUR CLEAR 01/17/2021 0832   LABSPEC 1.010 01/17/2021 0832   PHURINE 6.0 01/17/2021 0832   GLUCOSEU 50 (A) 01/17/2021 0832   HGBUR SMALL (A) 01/17/2021 0832   BILIRUBINUR NEGATIVE 01/17/2021 0832   KETONESUR NEGATIVE 01/17/2021 0832   PROTEINUR NEGATIVE 01/17/2021 0832   NITRITE  NEGATIVE 01/17/2021 0832   LEUKOCYTESUR NEGATIVE 01/17/2021 0832   Sepsis Labs Invalid input(s): PROCALCITONIN,  WBC,  LACTICIDVEN Microbiology Recent Results (from the past 240 hour(s))  Resp Panel by RT-PCR (Flu A&B, Covid) Nasopharyngeal Swab     Status: None   Collection Time:  01/17/21  8:29 AM   Specimen: Nasopharyngeal Swab; Nasopharyngeal(NP) swabs in vial transport medium  Result Value Ref Range Status   SARS Coronavirus 2 by RT PCR NEGATIVE NEGATIVE Final    Comment: (NOTE) SARS-CoV-2 target nucleic acids are NOT DETECTED.  The SARS-CoV-2 RNA is generally detectable in upper respiratory specimens during the acute phase of infection. The lowest concentration of SARS-CoV-2 viral copies this assay can detect is 138 copies/mL. A negative result does not preclude SARS-Cov-2 infection and should not be used as the sole basis for treatment or other patient management decisions. A negative result may occur with  improper specimen collection/handling, submission of specimen other than nasopharyngeal swab, presence of viral mutation(s) within the areas targeted by this assay, and inadequate number of viral copies(<138 copies/mL). A negative result must be combined with clinical observations, patient history, and epidemiological information. The expected result is Negative.  Fact Sheet for Patients:  BloggerCourse.com  Fact Sheet for Healthcare Providers:  SeriousBroker.it  This test is no t yet approved or cleared by the [place] FDA and  has been authorized for detection and/or diagnosis of SARS-CoV-2 by FDA under an Emergency Use Authorization (EUA). This EUA will remain  in effect (meaning this test can be used) for the duration of the COVID-19 declaration under Section 564(b)(1) of the Act, 21 U.S.C.section 360bbb-3(b)(1), unless the authorization is terminated  or revoked sooner.       Influenza A by PCR NEGATIVE NEGATIVE Final   Influenza B by PCR NEGATIVE NEGATIVE Final    Comment: (NOTE) The Xpert Xpress SARS-CoV-2/FLU/RSV plus assay is intended as an aid in the diagnosis of influenza from Nasopharyngeal swab specimens and should not be used as a sole basis for treatment. Nasal washings  and aspirates are unacceptable for Xpert Xpress SARS-CoV-2/FLU/RSV testing.  Fact Sheet for Patients: BloggerCourse.com  Fact Sheet for Healthcare Providers: SeriousBroker.it  This test is not yet approved or cleared by the [place] FDA and has been authorized for detection and/or diagnosis of SARS-CoV-2 by FDA under an Emergency Use Authorization (EUA). This EUA will remain in effect (meaning this test can be used) for the duration of the COVID-19 declaration under Section 564(b)(1) of the Act, 21 U.S.C. section 360bbb-3(b)(1), unless the authorization is terminated or revoked.  Performed at [facility], [address], [place], [place] [address]      Time coordinating discharge: Over 30 minutes  SIGNED:   Cipriano Bunker, MD  [facility] 01/20/2021, 1:15 PM Pager   If 7PM-7AM, please contact night-coverage [email or web address]

## 2021-01-24 ENCOUNTER — Ambulatory Visit (INDEPENDENT_AMBULATORY_CARE_PROVIDER_SITE_OTHER): Payer: Medicaid Other | Admitting: Internal Medicine

## 2021-01-24 ENCOUNTER — Telehealth: Payer: Self-pay | Admitting: *Deleted

## 2021-01-24 ENCOUNTER — Encounter: Payer: Self-pay | Admitting: Internal Medicine

## 2021-01-24 VITALS — BP 159/96 | HR 73 | Temp 98.1°F | Ht 72.0 in | Wt 278.0 lb

## 2021-01-24 DIAGNOSIS — M5412 Radiculopathy, cervical region: Secondary | ICD-10-CM | POA: Diagnosis not present

## 2021-01-24 DIAGNOSIS — S0592XA Unspecified injury of left eye and orbit, initial encounter: Secondary | ICD-10-CM

## 2021-01-24 DIAGNOSIS — H43392 Other vitreous opacities, left eye: Secondary | ICD-10-CM

## 2021-01-24 DIAGNOSIS — M5416 Radiculopathy, lumbar region: Secondary | ICD-10-CM | POA: Diagnosis not present

## 2021-01-24 DIAGNOSIS — I10 Essential (primary) hypertension: Secondary | ICD-10-CM | POA: Diagnosis not present

## 2021-01-24 MED ORDER — ASPIRIN EC 81 MG PO TBEC: 81.0000 mg | DELAYED_RELEASE_TABLET | Freq: Every day | ORAL | 2 refills | Status: AC

## 2021-01-24 MED ORDER — OXYCODONE-ACETAMINOPHEN 7.5-325 MG PO TABS: 1.0000 | ORAL_TABLET | Freq: Three times a day (TID) | ORAL | 0 refills | Status: DC | PRN

## 2021-01-24 MED ORDER — AMLODIPINE BESYLATE 10 MG PO TABS: 10.0000 mg | ORAL_TABLET | Freq: Every day | ORAL | 5 refills | Status: DC

## 2021-01-24 NOTE — Progress Notes (Signed)
   CC: Cervical radiculopathy, floaters in visual field, hypertension  HPI:[name] is a 65 y.o. male who presents for evaluation of cervical radiculopathy, floaters in visual field, and hypertension. Please see individual problem based A/P for details.   Past Medical History:  Diagnosis Date  . Heart failure (HCC)   . Hypertension   . Low back pain    Review of Systems:   Review of Systems  Constitutional: Negative for chills and fever.  Eyes: Positive for blurred vision. Negative for double vision, pain and discharge.  Musculoskeletal: Positive for back pain. Negative for falls.     Physical Exam: Vitals:   01/24/21 1012  BP: (!) 159/96  Pulse: 73  Temp: 98.1 F (36.7 C)  TempSrc: Oral  SpO2: 100%  Weight: 278 lb (126.1 kg)  Height: 6' (1.829 m)   General: Obese man, in pain with small movements HEENT: Conjunctiva nl , antiicteric sclerae, moist mucous membranes, no exudate or erythema Cardiovascular: Normal rate, regular rhythm.  No murmurs, rubs, or gallops Pulmonary : Equal breath sounds, No wheezes, rales, or rhonchi Neuro: Equal sensation in all 4 ext, 4/5 left hand grip, 4/5 left knee raise   Assessment & Plan:   See Encounters Tab for problem based charting.  Patient discussed with Dr. Antony Contras

## 2021-01-24 NOTE — Telephone Encounter (Signed)
Patient called in requesting PA on oxycodone. Explained PA staff will not be in till Monday. He states understanding.

## 2021-01-24 NOTE — Patient Instructions (Signed)
Thank you for trusting me with your care. To recap, today we discussed the following:   1. Cervical radiculopathy  - Ambulatory referral to Neurosurgery - oxyCODONE-acetaminophen (PERCOCET) 7.5-325 MG tablet; Take 1 tablet by mouth every 8 (eight) hours as needed for severe pain.  Dispense: 90 tablet; Refill: 0  2. Floaters in visual field, left  - Ambulatory referral to Ophthalmology  3. Hypertension, unspecified type  - Increase amlodipine to 10 mg

## 2021-01-26 ENCOUNTER — Encounter: Payer: Self-pay | Admitting: Internal Medicine

## 2021-01-26 NOTE — Assessment & Plan Note (Addendum)
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 Patient is in significantly pain with minor movements. Recently in presented to ED, prescribed percocet which help patient with daily functions. He reports he needed #2 5-325 Percocets in order to get relief of pain, but sometimes pain was managed with 1 tab. He has not followed up with Neurosurgery since discharge . I will place referral. Also needs to follow up with Lumbar MRI ordered by Dr. Cleaster Corin.   Cervical radiculopathy - Ambulatory referral to Neurosurgery - oxyCODONE-acetaminophen (PERCOCET) 7.5-325 MG tablet; Take 1 tablet by mouth every 8 (eight) hours as needed for severe pain.  Dispense: 90 tablet; Refill: 0 - Discussed pain contract, will need to sign contract once we have established pain regimen

## 2021-01-26 NOTE — Assessment & Plan Note (Signed)
Patient has not completed MRI lumbar spine, he will need to follow up on test ordered.

## 2021-01-26 NOTE — Assessment & Plan Note (Signed)
Patient continues to see floater in left eye. He reports he saw a optometrist and had a vision test. He says nothing was seen in his eye and nothing visible today. Both eyes are red and he sees floater in left eye. I will place another referral to ophthalmology for evaluation.

## 2021-01-26 NOTE — Assessment & Plan Note (Signed)
  Patient's BP today is 159/96 with a goal of <140/80. The patient endorses adherence to his medication regimen. He is currently taking olmesartan 40 mg and amlodipine 5 mg. He is in pain today which can effect BP.   Plan: Continue olmesartan 40 mg daily  Increase amlodopine to 10 mg  Counciled on diet and exercise

## 2021-01-27 ENCOUNTER — Telehealth: Payer: Self-pay | Admitting: *Deleted

## 2021-01-27 NOTE — Telephone Encounter (Signed)
PA was done and medication was approved.

## 2021-01-27 NOTE — Progress Notes (Signed)
Internal Medicine Clinic Attending  Case discussed with Dr. Barbaraann Faster  At the time of the visit.  We reviewed the resident's history and exam and pertinent patient test results.  I agree with the assessment, diagnosis, and plan of care documented in the resident's note.  Patient has severe multilevel cervical disc disease with spinal canal stenosis, severe bilateral foraminal narrowing, and evidence of spinal cord flattening on recent MRI. He has severe pain and weakness which limit his day to day activities. We are starting opioid therapy. He will follow up again at the end of the month with PCP for a UDS and to sign a pain contract. He has been referred to neurosurgery as well.

## 2021-01-27 NOTE — Telephone Encounter (Signed)
Information was sent through CoverMyMeds to [facility] Medicaid [facility] for PA for Oxycodone Acetaminophen.  PA for Oxycodone Acetaminophen was approved 01/24/2021 through 07/30/2021.  Angelina Ok, RN 01/27/2021 12:06 PM.

## 2021-03-11 ENCOUNTER — Ambulatory Visit (INDEPENDENT_AMBULATORY_CARE_PROVIDER_SITE_OTHER): Payer: Medicaid Other | Admitting: Internal Medicine

## 2021-03-11 ENCOUNTER — Encounter: Payer: Self-pay | Admitting: Internal Medicine

## 2021-03-11 VITALS — BP 183/92 | HR 81 | Temp 98.6°F | Wt 291.2 lb

## 2021-03-11 DIAGNOSIS — I10 Essential (primary) hypertension: Secondary | ICD-10-CM

## 2021-03-11 DIAGNOSIS — R3914 Feeling of incomplete bladder emptying: Secondary | ICD-10-CM | POA: Diagnosis not present

## 2021-03-11 DIAGNOSIS — N400 Enlarged prostate without lower urinary tract symptoms: Secondary | ICD-10-CM | POA: Insufficient documentation

## 2021-03-11 DIAGNOSIS — M5412 Radiculopathy, cervical region: Secondary | ICD-10-CM

## 2021-03-11 DIAGNOSIS — I509 Heart failure, unspecified: Secondary | ICD-10-CM | POA: Diagnosis not present

## 2021-03-11 DIAGNOSIS — N1832 Chronic kidney disease, stage 3b: Secondary | ICD-10-CM

## 2021-03-11 DIAGNOSIS — N401 Enlarged prostate with lower urinary tract symptoms: Secondary | ICD-10-CM | POA: Diagnosis not present

## 2021-03-11 MED ORDER — FUROSEMIDE 40 MG PO TABS: 40.0000 mg | ORAL_TABLET | Freq: Two times a day (BID) | ORAL | 1 refills | Status: DC

## 2021-03-11 MED ORDER — TAMSULOSIN HCL 0.4 MG PO CAPS: 0.4000 mg | ORAL_CAPSULE | Freq: Every day | ORAL | 2 refills | Status: AC

## 2021-03-11 MED ORDER — OXYCODONE-ACETAMINOPHEN 7.5-325 MG PO TABS: 1.0000 | ORAL_TABLET | Freq: Three times a day (TID) | ORAL | 0 refills | Status: DC | PRN

## 2021-03-11 MED ORDER — GABAPENTIN 300 MG PO CAPS: 300.0000 mg | ORAL_CAPSULE | Freq: Three times a day (TID) | ORAL | 1 refills | Status: AC

## 2021-03-11 NOTE — Progress Notes (Addendum)
   CC: Spinal stenosis, HFpEF, HTN  HPI:  [name] is a 65 y.o. with a PMHx as listed below who presents to the clinic for Spinal stenosis, HFpEF, HTN.   Please see the Encounters tab for problem-based Assessment & Plan regarding status of patient's acute and chronic conditions.  Past Medical History:  Diagnosis Date   Heart failure (HCC)    Hypertension    Hypertensive urgency 01/17/2021   Low back pain    Review of Systems: Review of Systems  Constitutional:  Negative for chills, fever and weight loss.       + weight gain  Respiratory:  Negative for shortness of breath and wheezing.   Cardiovascular:  Positive for leg swelling. Negative for chest pain and orthopnea.  Gastrointestinal:  Negative for abdominal pain, diarrhea, nausea and vomiting.  Musculoskeletal:  Positive for back pain and myalgias.  Neurological:  Positive for sensory change and weakness. Negative for dizziness and headaches.   Physical Exam:  Vitals:   03/11/21 0952 03/11/21 1038  BP: (!) 182/95 (!) 183/92  Pulse: 85 81  Temp: 98.6 F (37 C)   TempSrc: Oral   SpO2: 100%   Weight: 291 lb 3.2 oz (132.1 kg)    Physical Exam Vitals and nursing note reviewed.  Constitutional:      General: He is not in acute distress.    Appearance: He is obese.  HENT:     Head: Normocephalic and atraumatic.  Cardiovascular:     Rate and Rhythm: Normal rate and regular rhythm.     Heart sounds: No murmur heard. Pulmonary:     Effort: Pulmonary effort is normal. No respiratory distress.     Breath sounds: Normal breath sounds. No wheezing, rhonchi or rales.  Abdominal:     General: Bowel sounds are normal. There is no distension.     Palpations: Abdomen is soft.     Tenderness: There is no abdominal tenderness. There is no guarding.  Musculoskeletal:     Right lower leg: Edema (+1 pitting) present.     Left lower leg: Edema (+1 pitting) present.  Skin:    General: Skin is warm and dry.  Neurological:      Mental Status: He is alert and oriented to person, place, and time. Mental status is at baseline.  Psychiatric:        Mood and Affect: Mood normal.        Behavior: Behavior normal.    Assessment & Plan:   See Encounters Tab for problem based charting.  Patient discussed with Dr. Mikey Bussing

## 2021-03-11 NOTE — Patient Instructions (Addendum)
It was nice seeing you today! Thank you for choosing [facility] for your Primary Care.    Today we talked about:   Chronic Pain: I have sent in refills for Percocet and Gabapentin. Please follow up in 1 month for the Percocet refill.   Blood Pressure: When you go home, make sure your Amlodipine dose is 10 mg. If you can not find the dose on the bottle, contact the clinic and we can help. Continue taking Olmesartan daily.   Heart Failure: Restart your twice daily Lasix. If possible, weigh yourself daily and keep a log.   I have sent in a refill for Flomax.    I will call with your blood work results.

## 2021-03-12 DIAGNOSIS — N1832 Chronic kidney disease, stage 3b: Secondary | ICD-10-CM | POA: Insufficient documentation

## 2021-03-12 LAB — PROTEIN / CREATININE RATIO, URINE
Creatinine, Urine: 75.2 mg/dL
Protein, Ur: 12.5 mg/dL
Protein/Creat Ratio: 166 mg/g creat (ref 0–200)

## 2021-03-12 LAB — MICROALBUMIN / CREATININE URINE RATIO
Microalb/Creat Ratio: 30 mg/g creat — ABNORMAL HIGH (ref 0–29)
Microalbumin, Urine: 22.7 ug/mL

## 2021-03-12 LAB — BMP8+ANION GAP
Anion Gap: 16 mmol/L (ref 10.0–18.0)
BUN/Creatinine Ratio: 19 (ref 10–24)
BUN: 38 mg/dL — ABNORMAL HIGH (ref 8–27)
CO2: 21 mmol/L (ref 20–29)
Calcium: 9.1 mg/dL (ref 8.6–10.2)
Chloride: 106 mmol/L (ref 96–106)
Creatinine, Ser: 2.02 mg/dL — ABNORMAL HIGH (ref 0.76–1.27)
Glucose: 93 mg/dL (ref 65–99)
Potassium: 4.8 mmol/L (ref 3.5–5.2)
Sodium: 143 mmol/L (ref 134–144)
eGFR: 36 mL/min/{1.73_m2} — ABNORMAL LOW (ref >59)

## 2021-03-12 NOTE — Assessment & Plan Note (Signed)
Mr. Dykema endorses urinary straining in the past in addition to a feeling of incomplete emptying.  He notes that he has been on Flomax previously and this controlled his symptoms well, however when he moved back to [place] from [place], [place] was not represcribed.  He is requesting that it be refilled at this time.  Assessment/plan: - Start Flomax 0.4 mg daily after breakfast - Consider PSA for screening in the future

## 2021-03-12 NOTE — Assessment & Plan Note (Signed)
Per chart review, patient has a past medical history of stage IIIb chronic kidney disease dating back to at least 2021.  His creatinine has ranged from 1.7-2.3.  No previous evaluation by nephrology.  Likely etiology is uncontrolled longstanding hypertension.  We will start evaluation with urine studies today and a recheck of BMP.  - BMP pending - Urine protein creatinine ratio pending - Urine microalbumin creatinine ratio pending - Consider referral to nephrology in the future

## 2021-03-12 NOTE — Assessment & Plan Note (Addendum)
Mr. Lynnea Ferrier states that he has not been taking his Lasix for the past 48 hours at least given that he has been busy and is embarrassed by the frequent urination it causes.  He denies any orthopnea, dyspnea on exertion, chest pain at this time.  He does endorse swelling in his legs that goes up to his thighs though.  Assessment/plan: Recent echo obtained in May 2022 demonstrated EF within normal limits of 55% to 60%.  There was an asymmetric left ventricular hypertrophy noted that predominantly affecting the basal and septal portions.  Although the likely culprit is uncontrolled longstanding hypertension, given the asymmetric nature, patient would benefit from further evaluation; due to this we will eventually need a referral to cardiology.  Today, patient is hypervolemic with 1+ pitting edema bilaterally up to the thighs.  Given that he has no respiratory symptoms and pulmonary examination is reassuring, no indication at this time for admission.  He is 13 pounds up from his last visit; unknown dry weight.  Encouraged that he restart Lasix and begin weighing himself daily.  - Discussed cardiology referral at next appointment - Restart Lasix 40 mg twice daily - Continue olmesartan 40 mg daily - 1 month follow-up

## 2021-03-12 NOTE — Assessment & Plan Note (Signed)
Frank Figueroa states that he was taking his Percocet as prescribed and no more than 3 tablets/day.  When he was taking his medication he was able to be more active and go on daily walks.  His goal is to be more active so he can lose weight.  Endorses numbness and tingling in his bilateral arms and legs that is uneven.  Assessment/plan: Cervical radiculopathy secondary to severe spinal stenosis.  Neurosurgery referral was placed at last visit and currently pending.  I am unable to see if an appointment has been made.  Given that his pain was well controlled with Percocet and allow patient to become more active in his life, will continue with opioid therapy at this time.  Additional medication management includes gabapentin although the dose is limited by patient's chronic kidney disease.  We will recheck a BMP today to assess renal function to see if gabapentin dose can be adjusted.  - Refilled Percocet 7.5 mg 1 tab 3 times daily as needed, #90 tablets - Controlled substance contract filled and signed - Refilled gabapentin 300 mg 3 times daily - BMP pending

## 2021-03-12 NOTE — Assessment & Plan Note (Addendum)
BP: (!) 183/92  Frank Figueroa states that he has been taking his amlodipine and olmesartan daily as instructed.  He denies any chest pain with exertion or at rest, dizziness, headache, shortness of breath.  Assessment/plan: Blood pressure is markedly elevated today however patient is asymptomatic.  Likely contributors are his hypervolemic state, current pain levels.  On further chart review, it is also noted that patient picked up both amlodipine 5 mg and 10 mg in June 2022.  I encouraged patient to check his pill bottles when he arrives home and ensure he is taking the 10 mg.  - Continue olmesartan 40 mg daily - Continue amlodipine 10 mg daily - 1 month follow-up for BP recheck - Patient remains hypervolemic and hypertensive at next appointment, consider addition of a thiazide

## 2021-03-13 ENCOUNTER — Encounter: Payer: Self-pay | Admitting: Internal Medicine

## 2021-03-13 NOTE — Progress Notes (Signed)
Renal function is stable. UPCR negative. UACR essentially negative. Patient called and informed of results.

## 2021-03-14 NOTE — Progress Notes (Signed)
Internal Medicine Clinic Attending  Case discussed with Dr. Basaraba  At the time of the visit.  We reviewed the resident's history and exam and pertinent patient test results.  I agree with the assessment, diagnosis, and plan of care documented in the resident's note.  

## 2021-04-08 ENCOUNTER — Ambulatory Visit (INDEPENDENT_AMBULATORY_CARE_PROVIDER_SITE_OTHER): Payer: Medicaid Other | Admitting: Internal Medicine

## 2021-04-08 ENCOUNTER — Encounter: Payer: Self-pay | Admitting: Internal Medicine

## 2021-04-08 VITALS — BP 172/102 | HR 74 | Temp 98.7°F | Wt 287.9 lb

## 2021-04-08 DIAGNOSIS — M25561 Pain in right knee: Secondary | ICD-10-CM | POA: Diagnosis not present

## 2021-04-08 DIAGNOSIS — S0592XD Unspecified injury of left eye and orbit, subsequent encounter: Secondary | ICD-10-CM

## 2021-04-08 DIAGNOSIS — M5412 Radiculopathy, cervical region: Secondary | ICD-10-CM | POA: Diagnosis not present

## 2021-04-08 DIAGNOSIS — I10 Essential (primary) hypertension: Secondary | ICD-10-CM

## 2021-04-08 DIAGNOSIS — S0592XA Unspecified injury of left eye and orbit, initial encounter: Secondary | ICD-10-CM | POA: Diagnosis not present

## 2021-04-08 DIAGNOSIS — I509 Heart failure, unspecified: Secondary | ICD-10-CM | POA: Diagnosis not present

## 2021-04-08 DIAGNOSIS — N401 Enlarged prostate with lower urinary tract symptoms: Secondary | ICD-10-CM | POA: Diagnosis present

## 2021-04-08 DIAGNOSIS — R3914 Feeling of incomplete bladder emptying: Secondary | ICD-10-CM

## 2021-04-08 MED ORDER — OMEPRAZOLE MAGNESIUM 20 MG PO TBEC: 20.0000 mg | DELAYED_RELEASE_TABLET | Freq: Every day | ORAL | 1 refills | Status: AC

## 2021-04-08 MED ORDER — FUROSEMIDE 40 MG PO TABS: 40.0000 mg | ORAL_TABLET | Freq: Every day | ORAL | 1 refills | Status: DC

## 2021-04-08 MED ORDER — OLMESARTAN-AMLODIPINE-HCTZ 40-10-25 MG PO TABS: 1.0000 | ORAL_TABLET | Freq: Every day | ORAL | 1 refills | Status: DC

## 2021-04-08 MED ORDER — OXYCODONE-ACETAMINOPHEN 7.5-325 MG PO TABS
1.0000 | ORAL_TABLET | Freq: Three times a day (TID) | ORAL | 0 refills | Status: DC | PRN
Start: 2021-04-10 — End: 2021-05-10

## 2021-04-08 MED ORDER — BLOOD PRESSURE MONITOR AUTOMAT DEVI: 1.0000 [IU] | Freq: Every day | 0 refills | Status: AC

## 2021-04-08 NOTE — Assessment & Plan Note (Signed)
Mr. Evilsizer has not taken his Lasix in the past 3 days and states he thought the Flomax replaced it. We discussed that the two medications have different uses and that he should continue to take the Lasix. He expressed concern about taking Lasix at night due to having to wake up to urinate or having accidents in his bed, so we will change this medication to once daily. Today he had 1+ BL pitting edema that extended midway up his shins and is 4 lbs down from last visit. He denies orthopnea, dyspnea on exertion, chest pain, or the need to sleep elevated with multiple pillows.   Plan:  -Change Lasix to 40mg  once in the morning  -Continue Olmesartan 40mg  (in Tribenzor) -Follow-up in with Dr. in December  -Consider cardiology referral at next visit

## 2021-04-08 NOTE — Assessment & Plan Note (Addendum)
Frank Figueroa notes a few weeks of right knee pain in which he feels popping and a sensation that his leg is going to snap when he stands up. He has chronic pain secondary to spinal stenosis but states this is different and he has not experienced it before. His knee exam today is suggestive of a meniscus tear, with a positive McMurray test. Based on MRI results, we will consider an ortho referral.   Plan:  -MRI ordered, based on results we will consider ortho referral

## 2021-04-08 NOTE — Assessment & Plan Note (Addendum)
Vitals:   04/08/21 0932 04/08/21 0937  BP: (!) 182/108 (!) 172/102   Frank Figueroa states that he has continued Amlodipine 10mg  and Olmesartan 40mg  as instructed, but did not take his medication this morning as he usually takes it at 10 AM. It is unclear whether his blood pressure reading today accurately reflects his overall BP control, but given his history of severe HTN and HFpEF, he would likely benefit from the addition of a thiazide. He is amenable to starting HCTZ and combining his BP regimen into 1 pill (Tribenzor), as long as insurance covers it. We will obtain BMP and Mg today since he is on Lasix, and re-check BMP in one month since we are adding HCTZ. He has used a home wrist cuff in the past to check his BP, but does not have one currently. He denies chest pain, headache, blurred vision, dizziness, and shortness of breath today  Plan:  -Start Tribenzor combo pill (Olmesartan 40mg , Amlodipine 10mg , HCTZ 25mg ) -BMP and Mg results pending  -Repeat BMP in one month at lab-only visit -DME order placed for home BP cuff (Medicaid was covering this at the start of COVID) -Follow-up with Dr. in December

## 2021-04-08 NOTE — Assessment & Plan Note (Addendum)
Patient endorses a sensation that something is present in his left eye, nothing visible on exam today. He has seen an optometrist in the past, but not an ophthalmologist.   Plan:  -Ophthamology referral placed today

## 2021-04-08 NOTE — Progress Notes (Deleted)
   CC: ***  HPI:[name] is a 65 y.o. male who presents for evaluation of ***. Please see individual problem based A/P for details.  ***  Assessment: ***  Plan: ***  ***  Assessment: ***  Plan: ***  ***  Assessment: ***  Plan: ***   Depression, PHQ-9: Based on the patients  Flowsheet Row Office Visit from 03/11/2021 in [facility]  PHQ-9 Total Score 6      score we have ***.  Past Medical History:  Diagnosis Date   Heart failure (HCC)    Hypertension    Hypertensive urgency 01/17/2021   Low back pain    Review of Systems:   ROS   Physical Exam: There were no vitals filed for this visit.   General: *** HEENT: Conjunctiva nl , antiicteric sclerae, moist mucous membranes, no exudate or erythema Cardiovascular: Normal rate, regular rhythm.  No murmurs, rubs, or gallops Pulmonary : Equal breath sounds, No wheezes, rales, or rhonchi Abdominal: soft, nontender,  bowel sounds present Ext: No edema in lower extremities, no tenderness to palpation of lower extremities.   Assessment & Plan:   See Encounters Tab for problem based charting.  Patient {GC/GE:[id number]::"discussed with","seen with"} Dr. {WCHEN:[id number]::"P. Hoffman","Guilloud","Mullen","Narendra","Raines","Vincent","Williams"}

## 2021-04-08 NOTE — Progress Notes (Addendum)
   This is a Psychologist, occupational Note.  The care of the patient was discussed with Dr. Barbaraann Figueroa and the assessment and plan was formulated with their assistance.  Please see their note for official documentation of the patient encounter.   Subjective:     Patient ID: Frank Figueroa, male   DOB: Aug 26, 1956, 65 y.o.   MRN: [id number]  Frank Figueroa is a 65 y.o. male with HTN, HFpEF, CKD, BPH, and cervical and lumbar radiculopathy secondary to spinal stenosis, who presents for chronic disease follow-up as well as new onset right knee pain.    Review of Systems  Constitutional:  Positive for fatigue. Negative for fever.  HENT:  Negative for sore throat.   Eyes:  Positive for pain.  Respiratory:  Negative for shortness of breath.   Cardiovascular:  Positive for leg swelling. Negative for chest pain and palpitations.  Gastrointestinal:  Negative for abdominal pain, nausea and vomiting.  Genitourinary:  Positive for frequency and urgency. Negative for dysuria and hematuria.  Musculoskeletal:  Positive for arthralgias, back pain and gait problem.  Neurological:  Positive for numbness. Negative for light-headedness and headaches.      Objective:    Today's Vitals   04/08/21 0932 04/08/21 0937  BP: (!) 182/108 (!) 172/102  Pulse: 79 74  Temp: 98.7 F (37.1 C)   TempSrc: Oral   SpO2: 99%   Weight: 287 lb 14.4 oz (130.6 kg)   PainSc:  7    Body mass index is 39.05 kg/m.  Physical Exam Vitals reviewed.  Constitutional:      Appearance: He is obese.  Cardiovascular:     Rate and Rhythm: Normal rate and regular rhythm.     Heart sounds: Normal heart sounds.  Pulmonary:     Effort: Pulmonary effort is normal.     Breath sounds: Normal breath sounds.  Musculoskeletal:     Right lower leg: 1+ Pitting Edema present.     Left lower leg: 1+ Pitting Edema present.  Skin:    General: Skin is warm and dry.  Neurological:     Mental Status: He is alert and oriented to person, place, and time.      Gait: Gait abnormal.  Psychiatric:        Mood and Affect: Mood normal.  Knee: - Inspection: No gross deformity. No effusion. No erythema or bruising. Skin intact - Palpation: no TTP - ROM: full active ROM with flexion and extension in knee - Strength: 5/5 strength - Neuro/vasc: NV intact - Special Tests: - LIGAMENTS: negative anterior/posterior drawer, negative Lachman's, no MCL or LCL laxity  -- MENISCUS: positive lateral McMurray's -- PF JOINT: nml patellar mobility without apprehension. Negative patellar grind    Assessment:     65 year old male here for chronic disease management and right knee pain.     Plan:     Please see encounter tab for problem-based assessment and plan.   Attestation for Student Documentation:  I personally was present and performed or re-performed the history, physical exam and medical decision-making activities of this service and have verified that the service and findings are accurately documented in the student's note.  Albertha Ghee, MD 04/08/2021, 12:29 PM

## 2021-04-08 NOTE — Patient Instructions (Signed)
1. Heart failure  - Magnesium - furosemide (LASIX) 40 MG tablet; Take 1 tablet (40 mg total) by mouth daily.  Dispense: 60 tablet; Refill:   2. Benign prostatic hyperplasia  (BPH) Continue taking your Flomax daily.  - PSA  3. High blood pressure I sent a prescription for a blood pressure cough. Take your blood pressure daily for 2 weeks.and  bring the log to your next visit.  - BMP8+Anion Gap  4. Left eye pain - Ambulatory referral to Ophthalmology  5. Acute pain of right knee - For home use only DME Cane - MR Knee Right Wo Contrast; Future

## 2021-04-08 NOTE — Assessment & Plan Note (Addendum)
Frank Figueroa continues to endorse urinary frequency and urgency, as well as a feeling of incomplete emptying. He states that Flomax has been helpful in reducing his frequency and urgency, especially at night time. He is apprehensive about taking Lasix twice a day because the night time dose causes him to wake up and urinate and then he is unable to go back to sleep, or he will have be in a deep sleep and have accidents in his bed. We will check a PSA today.   Plan:  -Continue Flomax 0.4mg  daily after breakfast  -Change Lasix to morning dose only (see HTN problem)  -PSA results pending

## 2021-04-09 ENCOUNTER — Telehealth: Payer: Self-pay

## 2021-04-09 LAB — BMP8+ANION GAP
Anion Gap: 17 mmol/L (ref 10.0–18.0)
BUN/Creatinine Ratio: 15 (ref 10–24)
BUN: 31 mg/dL — ABNORMAL HIGH (ref 8–27)
CO2: 21 mmol/L (ref 20–29)
Calcium: 9.1 mg/dL (ref 8.6–10.2)
Chloride: 106 mmol/L (ref 96–106)
Creatinine, Ser: 2.13 mg/dL — ABNORMAL HIGH (ref 0.76–1.27)
Glucose: 96 mg/dL (ref 65–99)
Potassium: 4.8 mmol/L (ref 3.5–5.2)
Sodium: 144 mmol/L (ref 134–144)
eGFR: 34 mL/min/{1.73_m2} — ABNORMAL LOW (ref >59)

## 2021-04-09 LAB — PSA: Prostate Specific Ag, Serum: 1.6 ng/mL (ref 0.0–4.0)

## 2021-04-09 LAB — MAGNESIUM: Magnesium: 2.4 mg/dL — ABNORMAL HIGH (ref 1.6–2.3)

## 2021-04-09 NOTE — Telephone Encounter (Signed)
Requesting to speak with Dr. Steen, please call pt back.  

## 2021-04-10 NOTE — Progress Notes (Signed)
Patient called.  Patient aware. He was unable to obtain blood pressure monitor at pharmacy. I will check into this.

## 2021-04-17 NOTE — Progress Notes (Signed)
Internal Medicine Clinic Attending  Case discussed with Dr. Barbaraann Faster  and MS 3 Blanks at the time of the visit.  We reviewed the resident's history and exam and pertinent patient test results.  I agree with the assessment, diagnosis, and plan of care documented in the resident's note.

## 2021-05-01 ENCOUNTER — Ambulatory Visit: Payer: Medicaid Other | Admitting: Orthopedic Surgery

## 2021-05-05 ENCOUNTER — Encounter: Payer: Self-pay | Admitting: Internal Medicine

## 2021-05-05 ENCOUNTER — Ambulatory Visit (INDEPENDENT_AMBULATORY_CARE_PROVIDER_SITE_OTHER): Payer: Medicaid Other | Admitting: Internal Medicine

## 2021-05-05 ENCOUNTER — Other Ambulatory Visit: Payer: Self-pay

## 2021-05-05 VITALS — BP 158/89 | HR 76 | Temp 98.3°F | Ht 72.0 in | Wt 289.5 lb

## 2021-05-05 DIAGNOSIS — M25561 Pain in right knee: Secondary | ICD-10-CM

## 2021-05-05 DIAGNOSIS — N529 Male erectile dysfunction, unspecified: Secondary | ICD-10-CM | POA: Diagnosis not present

## 2021-05-05 DIAGNOSIS — I10 Essential (primary) hypertension: Secondary | ICD-10-CM | POA: Diagnosis not present

## 2021-05-05 DIAGNOSIS — I5030 Unspecified diastolic (congestive) heart failure: Secondary | ICD-10-CM

## 2021-05-05 MED ORDER — SILDENAFIL CITRATE 100 MG PO TABS: 50.0000 mg | ORAL_TABLET | ORAL | 1 refills | Status: AC | PRN

## 2021-05-05 NOTE — Progress Notes (Deleted)
   CC: HFpEF, Pain  HPI:Frank Figueroa is a 65 y.o. male who presents for evaluation of ***. Please see individual problem based A/P for details.   *** Assessment/Plan: ***  *** Assessment/Plan: ***  *** Assessment/Plan: ***    Depression, PHQ-9: Based on the patients  Flowsheet Row Office Visit from 05/05/2021 in [facility]  PHQ-9 Total Score 0      score we have ***.  Past Medical History:  Diagnosis Date   Heart failure (HCC)    Hypertension    Hypertensive urgency 01/17/2021   Low back pain    Review of Systems:   ROS   Physical Exam: Vitals:   05/05/21 0845  BP: (!) 158/89  Pulse: 76  Temp: 98.3 F (36.8 C)  TempSrc: Oral  SpO2: 97%  Weight: 289 lb 8 oz (131.3 kg)  Height: 6' (1.829 m)     General: *** HEENT: Conjunctiva nl , antiicteric sclerae, moist mucous membranes, no exudate or erythema Cardiovascular: Normal rate, regular rhythm.  No murmurs, rubs, or gallops Pulmonary : Equal breath sounds, No wheezes, rales, or rhonchi Abdominal: soft, nontender,  bowel sounds present Ext: No edema in lower extremities, no tenderness to palpation of lower extremities.   Assessment & Plan:   See Encounters Tab for problem based charting.  Patient {GC/GE:[id number]::"discussed with","seen with"} Dr. {GFQMK:[id number]::"V. Hoffman","Guilloud","Mullen","Narendra","Raines","Vincent","Williams"}

## 2021-05-05 NOTE — Assessment & Plan Note (Signed)
Wt Readings from Last 3 Encounters:  05/05/21 289 lb 8 oz (131.3 kg)  04/08/21 287 lb 14.4 oz (130.6 kg)  03/11/21 291 lb 3.2 oz (132.1 kg)   Lower extremity edema is improved and patient is taking lasix daily as prescribed. His weight is up 2lbs. We discussed working on weight loss for benefit in overall health and BP.   Assessment/Plan: HFpEF, euvolemic. I wonder if some of his lower extremity is secondary to obesity causing CVI. Will continue to manage with 40 mg  lasix once daily

## 2021-05-05 NOTE — Progress Notes (Signed)
   CC: HFpEF, HTN, Right knee pain, erectile dysfunction   HPI:[name] is a 65 y.o. male who presents for evaluation of HFpEF, HTN, right knee pain, erectile dysfunction. Please see individual problem based A/P for details.   Past Medical History:  Diagnosis Date   Heart failure (HCC)    Hypertension    Hypertensive urgency 01/17/2021   Low back pain    Review of Systems:   Review of Systems  Constitutional:  Negative for chills and fever.  Musculoskeletal:  Negative for falls. Back pain: chronic, controlled. Psychiatric/Behavioral:  Negative for depression. The patient is not nervous/anxious.     Physical Exam: Vitals:   05/05/21 0845  BP: (!) 158/89  Pulse: 76  Temp: 98.3 F (36.8 C)  TempSrc: Oral  SpO2: 97%  Weight: 289 lb 8 oz (131.3 kg)  Height: 6' (1.829 m)    General: obese, NAD HEENT: Conjunctiva nl , antiicteric sclerae Cardiovascular: Normal rate, regular rhythm.  No murmurs, rubs, or gallops, no JVD Ext: No edema in lower extremities, no tenderness to palpation of lower extremities.  Knee: - Inspection: No gross deformity. No effusion. No erythema or bruising. Skin intact - Palpation: no TTP of right knee - ROM: full active ROM with flexion and extension in knee - Strength: 4/5 strength on right knee extension - Neuro/vasc: NV intact - Special Tests: - LIGAMENTS: negative Lachman's, no MCL or LCL laxity  -- MENISCUS: negative McMurray's    Assessment & Plan:   See Encounters Tab for problem based charting.  Patient discussed with Dr. Mayford Knife

## 2021-05-05 NOTE — Assessment & Plan Note (Signed)
BP: (!) 158/89   Patient adherent to Tribenzor. A order was placed for BP cuff at last visit but patient did not get the BP cuff. He did get a walking cain. We supplied patient with cuff today for home monitoring. He will return in 2 weeks and if blood pressure elevated consider workup of resistant HTN at this point.   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 Reports sleep study negative for OSA 2 years ago. Patient has CKD stage 3b . Mild/moderate proteinuria on recent urine study. Given information on dash diet and recommended 150 minutes of exercise per week. Potassium wnl, although normokalemic does not rule out primary aldosteronism. - At follow up consider [place] for renal artery stenosis and primary aldosteronism workup if home BP elevated. - Continue counseling of weight loss, dash diet, avoidance of NSAIDS.

## 2021-05-05 NOTE — Assessment & Plan Note (Signed)
Patient reports a history of ED. He can not recall first time he had difficulty maintaining an erection, but it was treated when living in [place] with Viagra.   Assessment/plan: multiple etiologies possible for ED including vascular disease , on antihypertensives, and has spine disease. Given slow onset and improvement with medications will treat at this time . If patient does not respond to medications will consider further workup of etiology. - sildenafil (VIAGRA) 100 MG tablet; Take 0.5 tablets (50 mg total) by mouth as needed for erectile dysfunction.  Dispense: 20 tablet; Refill: 1

## 2021-05-05 NOTE — Assessment & Plan Note (Signed)
Patient has started using 4 point cane and knee pain improving. No popping sensation on exam. He continues to favor, with slight limp on ambulation.   Assessment/Plan: Right knee pain, improving. Not resolved. Will further evaluate with xray  - DG Knee Complete 4 Views Right; Future

## 2021-05-05 NOTE — Patient Instructions (Addendum)
Thank you, Frank Figueroa for allowing [place] to provide your care today. Today we discussed your blood pressure, erectile dysfunction, and knee pain.  Blood Pressure - Keep taking your medications every day. We are giving you a monitor to use at home. Please write down your blood pressure every day for the next two weeks and bring the information with you at your next visit.     ED - We have sent in a prescription for Viagra.   Knee Pain - We are glad to hear that your knee pain has improved. Since you are still having pain, we will have you get an Xray.   I have ordered the following labs for you:   Lab Orders         BMP8+Anion Gap       Follow up: 3 months    Remember: Bring the paper with your blood pressure measurements at your next visit.   Should you have any questions or concerns please call the internal medicine clinic at [phone].     Thurmon Fair, M.D. Dayna Barker, Medical Student [facility]

## 2021-05-06 LAB — BMP8+ANION GAP
Anion Gap: 17 mmol/L (ref 10.0–18.0)
BUN/Creatinine Ratio: 15 (ref 10–24)
BUN: 32 mg/dL — ABNORMAL HIGH (ref 8–27)
CO2: 18 mmol/L — ABNORMAL LOW (ref 20–29)
Calcium: 9.1 mg/dL (ref 8.6–10.2)
Chloride: 105 mmol/L (ref 96–106)
Creatinine, Ser: 2.09 mg/dL — ABNORMAL HIGH (ref 0.76–1.27)
Glucose: 92 mg/dL (ref 65–99)
Potassium: 4.5 mmol/L (ref 3.5–5.2)
Sodium: 140 mmol/L (ref 134–144)
eGFR: 35 mL/min/{1.73_m2} — ABNORMAL LOW (ref >59)

## 2021-05-06 NOTE — Progress Notes (Addendum)
Internal Medicine Clinic Attending ° °Case discussed with Dr. Steen  at the time of the visit.  We reviewed the resident’s history and exam and pertinent patient test results.  I agree with the assessment, diagnosis, and plan of care documented in the resident’s note.  °

## 2021-05-08 NOTE — Progress Notes (Signed)
Patient called.  Patient aware. Reminded patient to go by for xray of knee.

## 2021-05-13 ENCOUNTER — Telehealth: Payer: Self-pay | Admitting: Internal Medicine

## 2021-05-13 ENCOUNTER — Other Ambulatory Visit: Payer: Self-pay | Admitting: Internal Medicine

## 2021-05-13 NOTE — Telephone Encounter (Signed)
Refill Request  Pt states he signed a pain contract

## 2021-05-13 NOTE — Telephone Encounter (Signed)
Refill Request- Pt states he signed his Pain contract back on 03/11/2021 with Dr. Lin Givens.   Pt states he does not understand why each month the pharmacy states his pain medication is not on his Med List.  Pt would like a call back.   Pt requesting a Refill on the following medications:  Olmesartan-amLODIPine-HCTZ (TRIBENZOR) 40-10-25 MG TABS  oxyCODONE-acetaminophen (PERCOCET) 7.5-325 MG tablet [[id number]]

## 2021-05-14 MED ORDER — OLMESARTAN-AMLODIPINE-HCTZ 40-10-25 MG PO TABS: 1.0000 | ORAL_TABLET | Freq: Every day | ORAL | 3 refills | Status: AC

## 2021-05-14 MED ORDER — OXYCODONE-ACETAMINOPHEN 7.5-325 MG PO TABS: 1.0000 | ORAL_TABLET | Freq: Three times a day (TID) | ORAL | 0 refills | Status: DC | PRN

## 2021-05-14 NOTE — Telephone Encounter (Signed)
Reviewed and refilled, medication fell off med list due to duration listed as 30 days, I marked it as long term, hope that helps .

## 2021-05-14 NOTE — Telephone Encounter (Signed)
Patient calling back again. States he was supposed to get the oxycodone on 9/5. Oxycodone is not on current med list. Also, needs refill on olmesartan combo med. Please address today. Thank you.

## 2021-05-14 NOTE — Telephone Encounter (Signed)
Notified patient that med refills have been sent and reason oxycodone fell off med list. He was very Adult nurse.

## 2021-05-30 DIAGNOSIS — Z125 Encounter for screening for malignant neoplasm of prostate: Secondary | ICD-10-CM | POA: Diagnosis not present

## 2021-05-30 DIAGNOSIS — E669 Obesity, unspecified: Secondary | ICD-10-CM | POA: Diagnosis not present

## 2021-06-09 ENCOUNTER — Other Ambulatory Visit: Payer: Self-pay | Admitting: Internal Medicine

## 2021-06-09 NOTE — Telephone Encounter (Signed)
Last rx written 05/14/21. Last OV 05/05/21. Next OV has not been scheduled. UDS - not done.

## 2021-06-09 NOTE — Telephone Encounter (Signed)
Refill Request-    oxyCODONE-acetaminophen (PERCOCET) 7.5-325 MG tablet  [facility] [phone] - [place], [place] - [address] AT [address] (Ph: [phone])

## 2021-06-10 MED ORDER — OXYCODONE-ACETAMINOPHEN 7.5-325 MG PO TABS: 1.0000 | ORAL_TABLET | Freq: Three times a day (TID) | ORAL | 0 refills | Status: DC | PRN

## 2021-06-13 ENCOUNTER — Telehealth: Payer: Self-pay

## 2021-06-13 NOTE — Telephone Encounter (Signed)
Patient called back. States [facility] told him they cannot fill oxycodone today. He does not know why. Call placed to [facility]. She states patient was made aware that [employer] will not pay for this med as they mistakenly think he has another insurance. Patient was instructed to contact [employer] regarding this. He has done this and was told process would take 24 hours. Bernerd Limbo tried running med through again and it gives her the same message. Patient notified that Rx will most likely go through tomorrow.

## 2021-06-13 NOTE — Telephone Encounter (Signed)
Returned call to patient. No answer. Left message on VM requesting return call.  °

## 2021-06-13 NOTE — Telephone Encounter (Signed)
Please call pt back about med refill.

## 2021-06-16 ENCOUNTER — Other Ambulatory Visit: Payer: Self-pay

## 2021-06-16 MED ORDER — OXYCODONE-ACETAMINOPHEN 7.5-325 MG PO TABS: 1.0000 | ORAL_TABLET | Freq: Three times a day (TID) | ORAL | 0 refills | Status: DC | PRN

## 2021-06-16 NOTE — Telephone Encounter (Signed)
This RX was sent on 06/10/21 (please see phone note from 06/13/21 regarding problem with picking up this RX from [facility]). This RN called [facility] and spoke with the pharmacist, Kathlene November, who states RX will not go through [employer].   Kathlene November states it looks like patient's insurance is wanting the [facility] to [facility]. RN instructed Kathlene November to disregard the RX.  [facility], but this pharmacy is permanently closed.   TC to patient, VM obtained and message left to call nurse back.  Patient needs to choose another [facility]. SChaplin, RN,BSN

## 2021-06-16 NOTE — Telephone Encounter (Signed)
TC to patient to inform him that RX will be sent to [facility] and [address]. Informed pt that [facility] tried running it through and was unable to fill it d/t insurance denial with message he must fill [facility] a [facility].  Pt was instructed to bring his current insurance/[facility] card [facility] and present to the pharmacist, he verbalized understanding. SChaplin, RN,BSN

## 2021-06-16 NOTE — Telephone Encounter (Signed)
Call from pt - informed [facility] [address] [facility] per previous note. He stated any [facility] pharmacy- he decided the one on [address] - stated to let him know of their address.

## 2021-06-16 NOTE — Telephone Encounter (Signed)
oxyCODONE-acetaminophen (PERCOCET) 7.5-325 MG tablet, [facility] [address]

## 2021-07-07 ENCOUNTER — Other Ambulatory Visit: Payer: Self-pay | Admitting: Internal Medicine

## 2021-07-07 NOTE — Telephone Encounter (Signed)
Refill Request  oxyCODONE-acetaminophen (PERCOCET) 7.5-325 MG tablet  [id number] [place], [place] - [address] (Ph: [phone])

## 2021-07-08 MED ORDER — OXYCODONE-ACETAMINOPHEN 7.5-325 MG PO TABS: 1.0000 | ORAL_TABLET | Freq: Three times a day (TID) | ORAL | 0 refills | Status: DC | PRN

## 2021-07-14 ENCOUNTER — Telehealth: Payer: Self-pay

## 2021-07-14 NOTE — Telephone Encounter (Signed)
[facility]. 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

## 2021-07-14 NOTE — Telephone Encounter (Signed)
oxyCODONE-acetaminophen (PERCOCET) 7.5-325 MG tablet, refill request @ [id number] - [place], [place] - [address]

## 2021-08-06 ENCOUNTER — Other Ambulatory Visit: Payer: Self-pay | Admitting: Student

## 2021-08-06 DIAGNOSIS — I10 Essential (primary) hypertension: Secondary | ICD-10-CM

## 2021-08-11 ENCOUNTER — Other Ambulatory Visit: Payer: Self-pay | Admitting: Internal Medicine

## 2021-08-11 MED ORDER — OXYCODONE-ACETAMINOPHEN 7.5-325 MG PO TABS: 1.0000 | ORAL_TABLET | Freq: Three times a day (TID) | ORAL | 0 refills | Status: AC | PRN

## 2021-08-11 NOTE — Telephone Encounter (Signed)
Refill Request  oxyCODONE-acetaminophen (PERCOCET) 7.5-325 MG tablet  [id number] [place], [place] - [address] (Ph: [phone]) 

## 2021-08-15 ENCOUNTER — Telehealth: Payer: Self-pay | Admitting: *Deleted

## 2021-08-15 NOTE — Telephone Encounter (Signed)
PA is currently being worked on. Patient notified that insurance has 48-72 hours to respond.

## 2021-08-15 NOTE — Telephone Encounter (Signed)
[facility] stating he can't get his pain med. States he got a ride to the pharmacy and now is stuck. [facility] who states they [facility] Rx but it needs a PA. Will route to PA staff.

## 2021-08-18 ENCOUNTER — Telehealth: Payer: Self-pay

## 2021-08-18 NOTE — Telephone Encounter (Signed)
Pt is checking to see if oxyCODONE-acetaminophen (PERCOCET) 7.5-325 MG tablet has been approved. Please call pt back.

## 2021-08-18 NOTE — Telephone Encounter (Signed)
PA  for pt ( OXY-ACETAMINOPHEN 7.5-325 MG TABS ) came through on cover my meds was done and submitted with office notes from 8/2 .Marland Kitchen awaiting approval or denial

## 2021-08-18 NOTE — Telephone Encounter (Signed)
DECISION :      Approved today  Approved. This drug has been approved.   Approved quantity: 90 tablets per 30 day(s). You may fill up to a 34 day supply at a retail pharmacy.   You may fill up to a 90 day supply for maintenance drugs, please refer to the formulary for details.   Please call the pharmacy to process your prescription claim. Drug oxyCODONE-Acetaminophen 7.5-325MG  tablets Form [employer] Prior Authorization Request Form [phone] N[place])   ( COPY SENT TO PHARMACY ALSO )

## 2021-09-02 ENCOUNTER — Encounter: Payer: Medicaid Other | Admitting: Internal Medicine

## 2021-11-04 ENCOUNTER — Other Ambulatory Visit: Payer: Self-pay | Admitting: *Deleted

## 2021-11-04 DIAGNOSIS — I509 Heart failure, unspecified: Secondary | ICD-10-CM

## 2021-11-05 MED ORDER — FUROSEMIDE 40 MG PO TABS: 40.0000 mg | ORAL_TABLET | Freq: Every day | ORAL | 1 refills | Status: DC

## 2021-11-05 NOTE — Addendum Note (Signed)
Addended by: Fredderick Severance on: 11/05/2021 11:37 AM ? ? Modules accepted: Orders ? ?

## 2021-11-06 MED ORDER — FUROSEMIDE 40 MG PO TABS: 40.0000 mg | ORAL_TABLET | Freq: Every day | ORAL | 1 refills | Status: AC

## 2021-11-13 ENCOUNTER — Encounter: Payer: Self-pay | Admitting: *Deleted

## 2021-12-22 ENCOUNTER — Encounter: Payer: Medicaid Other | Admitting: Internal Medicine

## 2021-12-23 ENCOUNTER — Encounter: Payer: Self-pay | Admitting: Internal Medicine
